# Patient Record
Sex: Male | Born: 1940 | Race: White | Hispanic: No | Marital: Married | Smoking: Never smoker
Health system: Southern US, Community
[De-identification: ages and names within clinical notes are randomized; demographics above are authoritative.]

## PROBLEM LIST (undated history)

## (undated) DIAGNOSIS — I10 Essential (primary) hypertension: Secondary | ICD-10-CM

## (undated) HISTORY — PX: CARDIAC CATHETERIZATION: SHX172

## (undated) HISTORY — PX: REPLACEMENT TOTAL KNEE: SUR1224

## (undated) HISTORY — PX: CORONARY ARTERY BYPASS GRAFT: SHX141

## (undated) HISTORY — DX: Essential (primary) hypertension: I10

---

## 2005-11-26 ENCOUNTER — Ambulatory Visit: Payer: Self-pay | Admitting: Cardiology

## 2009-02-09 ENCOUNTER — Inpatient Hospital Stay (HOSPITAL_COMMUNITY): Admission: AD | Admit: 2009-02-09 | Discharge: 2009-02-18 | Payer: Self-pay | Admitting: Cardiology

## 2009-02-10 ENCOUNTER — Encounter (INDEPENDENT_AMBULATORY_CARE_PROVIDER_SITE_OTHER): Payer: Self-pay | Admitting: Cardiology

## 2009-02-11 ENCOUNTER — Ambulatory Visit: Payer: Self-pay | Admitting: Surgery

## 2009-02-12 ENCOUNTER — Encounter: Payer: Self-pay | Admitting: Surgery

## 2009-02-21 DIAGNOSIS — I2510 Atherosclerotic heart disease of native coronary artery without angina pectoris: Secondary | ICD-10-CM | POA: Insufficient documentation

## 2009-02-21 HISTORY — DX: Atherosclerotic heart disease of native coronary artery without angina pectoris: I25.10

## 2009-03-08 ENCOUNTER — Ambulatory Visit: Payer: Self-pay | Admitting: Surgery

## 2009-03-08 ENCOUNTER — Encounter: Admission: RE | Admit: 2009-03-08 | Discharge: 2009-03-08 | Payer: Self-pay | Admitting: Surgery

## 2009-03-17 ENCOUNTER — Encounter (HOSPITAL_COMMUNITY): Admission: RE | Admit: 2009-03-17 | Discharge: 2009-04-07 | Payer: Self-pay | Admitting: Cardiology

## 2009-04-08 ENCOUNTER — Encounter (HOSPITAL_COMMUNITY): Admission: RE | Admit: 2009-04-08 | Discharge: 2009-07-07 | Payer: Self-pay | Admitting: Cardiology

## 2010-03-30 LAB — CBC
HCT: 33.3 % — ABNORMAL LOW (ref 39.0–52.0)
HCT: 35.1 % — ABNORMAL LOW (ref 39.0–52.0)
HCT: 35.4 % — ABNORMAL LOW (ref 39.0–52.0)
HCT: 35.5 % — ABNORMAL LOW (ref 39.0–52.0)
HCT: 35.8 % — ABNORMAL LOW (ref 39.0–52.0)
HCT: 37.1 % — ABNORMAL LOW (ref 39.0–52.0)
Hemoglobin: 11.5 g/dL — ABNORMAL LOW (ref 13.0–17.0)
Hemoglobin: 11.5 g/dL — ABNORMAL LOW (ref 13.0–17.0)
Hemoglobin: 11.8 g/dL — ABNORMAL LOW (ref 13.0–17.0)
Hemoglobin: 11.9 g/dL — ABNORMAL LOW (ref 13.0–17.0)
Hemoglobin: 12.1 g/dL — ABNORMAL LOW (ref 13.0–17.0)
Hemoglobin: 12.6 g/dL — ABNORMAL LOW (ref 13.0–17.0)
MCHC: 33.9 g/dL (ref 30.0–36.0)
MCHC: 34 g/dL (ref 30.0–36.0)
MCHC: 34.2 g/dL (ref 30.0–36.0)
MCHC: 34.3 g/dL (ref 30.0–36.0)
MCHC: 34.4 g/dL (ref 30.0–36.0)
MCHC: 34.5 g/dL (ref 30.0–36.0)
MCHC: 34.5 g/dL (ref 30.0–36.0)
MCHC: 34.5 g/dL (ref 30.0–36.0)
MCHC: 34.6 g/dL (ref 30.0–36.0)
MCV: 91.3 fL (ref 78.0–100.0)
MCV: 91.7 fL (ref 78.0–100.0)
MCV: 92.4 fL (ref 78.0–100.0)
Platelets: 152 10*3/uL (ref 150–400)
Platelets: 170 10*3/uL (ref 150–400)
Platelets: 195 10*3/uL (ref 150–400)
Platelets: 196 10*3/uL (ref 150–400)
RBC: 3.62 MIL/uL — ABNORMAL LOW (ref 4.22–5.81)
RBC: 3.63 MIL/uL — ABNORMAL LOW (ref 4.22–5.81)
RBC: 3.64 MIL/uL — ABNORMAL LOW (ref 4.22–5.81)
RBC: 3.71 MIL/uL — ABNORMAL LOW (ref 4.22–5.81)
RBC: 3.81 MIL/uL — ABNORMAL LOW (ref 4.22–5.81)
RBC: 3.93 MIL/uL — ABNORMAL LOW (ref 4.22–5.81)
RBC: 3.94 MIL/uL — ABNORMAL LOW (ref 4.22–5.81)
RBC: 4.01 MIL/uL — ABNORMAL LOW (ref 4.22–5.81)
RBC: 4.16 MIL/uL — ABNORMAL LOW (ref 4.22–5.81)
RDW: 14.2 % (ref 11.5–15.5)
RDW: 14.2 % (ref 11.5–15.5)
RDW: 14.3 % (ref 11.5–15.5)
RDW: 14.3 % (ref 11.5–15.5)
RDW: 14.3 % (ref 11.5–15.5)
RDW: 14.4 % (ref 11.5–15.5)
RDW: 14.9 % (ref 11.5–15.5)
WBC: 14.1 10*3/uL — ABNORMAL HIGH (ref 4.0–10.5)
WBC: 9.4 10*3/uL (ref 4.0–10.5)
WBC: 9.9 10*3/uL (ref 4.0–10.5)

## 2010-03-30 LAB — HEMOGLOBIN AND HEMATOCRIT, BLOOD
HCT: 22.3 % — ABNORMAL LOW (ref 39.0–52.0)
Hemoglobin: 11.2 g/dL — ABNORMAL LOW (ref 13.0–17.0)
Hemoglobin: 7.7 g/dL — ABNORMAL LOW (ref 13.0–17.0)

## 2010-03-30 LAB — POCT I-STAT, CHEM 8
BUN: 17 mg/dL (ref 6–23)
Calcium, Ion: 1.08 mmol/L — ABNORMAL LOW (ref 1.12–1.32)
Chloride: 104 mEq/L (ref 96–112)
Creatinine, Ser: 1.1 mg/dL (ref 0.4–1.5)
Creatinine, Ser: 1.8 mg/dL — ABNORMAL HIGH (ref 0.4–1.5)
Glucose, Bld: 184 mg/dL — ABNORMAL HIGH (ref 70–99)
Glucose, Bld: 256 mg/dL — ABNORMAL HIGH (ref 70–99)
HCT: 33 % — ABNORMAL LOW (ref 39.0–52.0)
Hemoglobin: 11.2 g/dL — ABNORMAL LOW (ref 13.0–17.0)
Hemoglobin: 11.6 g/dL — ABNORMAL LOW (ref 13.0–17.0)
Potassium: 4.6 mEq/L (ref 3.5–5.1)
Sodium: 136 mEq/L (ref 135–145)
TCO2: 26 mmol/L (ref 0–100)

## 2010-03-30 LAB — GLUCOSE, CAPILLARY
Glucose-Capillary: 115 mg/dL — ABNORMAL HIGH (ref 70–99)
Glucose-Capillary: 129 mg/dL — ABNORMAL HIGH (ref 70–99)
Glucose-Capillary: 182 mg/dL — ABNORMAL HIGH (ref 70–99)
Glucose-Capillary: 188 mg/dL — ABNORMAL HIGH (ref 70–99)
Glucose-Capillary: 200 mg/dL — ABNORMAL HIGH (ref 70–99)
Glucose-Capillary: 200 mg/dL — ABNORMAL HIGH (ref 70–99)
Glucose-Capillary: 203 mg/dL — ABNORMAL HIGH (ref 70–99)
Glucose-Capillary: 214 mg/dL — ABNORMAL HIGH (ref 70–99)
Glucose-Capillary: 220 mg/dL — ABNORMAL HIGH (ref 70–99)
Glucose-Capillary: 297 mg/dL — ABNORMAL HIGH (ref 70–99)

## 2010-03-30 LAB — BASIC METABOLIC PANEL
BUN: 19 mg/dL (ref 6–23)
BUN: 22 mg/dL (ref 6–23)
BUN: 9 mg/dL (ref 6–23)
CO2: 26 mEq/L (ref 19–32)
CO2: 26 mEq/L (ref 19–32)
CO2: 27 mEq/L (ref 19–32)
Calcium: 7.5 mg/dL — ABNORMAL LOW (ref 8.4–10.5)
Calcium: 7.9 mg/dL — ABNORMAL LOW (ref 8.4–10.5)
Calcium: 8 mg/dL — ABNORMAL LOW (ref 8.4–10.5)
Chloride: 100 mEq/L (ref 96–112)
Chloride: 100 mEq/L (ref 96–112)
Chloride: 101 mEq/L (ref 96–112)
Chloride: 102 mEq/L (ref 96–112)
GFR calc Af Amer: 53 mL/min — ABNORMAL LOW (ref >60)
GFR calc Af Amer: >60 mL/min (ref >60)
GFR calc Af Amer: >60 mL/min (ref >60)
GFR calc Af Amer: >60 mL/min (ref >60)
GFR calc non Af Amer: 44 mL/min — ABNORMAL LOW (ref >60)
GFR calc non Af Amer: 56 mL/min — ABNORMAL LOW (ref >60)
GFR calc non Af Amer: 57 mL/min — ABNORMAL LOW (ref >60)
Glucose, Bld: 124 mg/dL — ABNORMAL HIGH (ref 70–99)
Glucose, Bld: 146 mg/dL — ABNORMAL HIGH (ref 70–99)
Glucose, Bld: 209 mg/dL — ABNORMAL HIGH (ref 70–99)
Potassium: 3.1 mEq/L — ABNORMAL LOW (ref 3.5–5.1)
Potassium: 3.5 mEq/L (ref 3.5–5.1)
Potassium: 3.9 mEq/L (ref 3.5–5.1)
Potassium: 4.2 mEq/L (ref 3.5–5.1)
Sodium: 135 mEq/L (ref 135–145)
Sodium: 136 mEq/L (ref 135–145)
Sodium: 136 mEq/L (ref 135–145)
Sodium: 137 mEq/L (ref 135–145)

## 2010-03-30 LAB — CREATININE, SERUM
Creatinine, Ser: 1.05 mg/dL (ref 0.4–1.5)
GFR calc Af Amer: 46 mL/min — ABNORMAL LOW (ref >60)
GFR calc non Af Amer: >60 mL/min (ref >60)

## 2010-03-30 LAB — COMPREHENSIVE METABOLIC PANEL
Albumin: 3.6 g/dL (ref 3.5–5.2)
BUN: 11 mg/dL (ref 6–23)
BUN: 26 mg/dL — ABNORMAL HIGH (ref 6–23)
CO2: 26 mEq/L (ref 19–32)
Calcium: 8.6 mg/dL (ref 8.4–10.5)
Calcium: 8.8 mg/dL (ref 8.4–10.5)
Creatinine, Ser: 1.03 mg/dL (ref 0.4–1.5)
Glucose, Bld: 171 mg/dL — ABNORMAL HIGH (ref 70–99)
Sodium: 134 mEq/L — ABNORMAL LOW (ref 135–145)
Sodium: 139 mEq/L (ref 135–145)
Total Protein: 6.5 g/dL (ref 6.0–8.3)

## 2010-03-30 LAB — POCT I-STAT 3, ART BLOOD GAS (G3+)
Acid-base deficit: 2 mmol/L (ref 0.0–2.0)
Acid-base deficit: 4 mmol/L — ABNORMAL HIGH (ref 0.0–2.0)
Bicarbonate: 24 mEq/L (ref 20.0–24.0)
O2 Saturation: 95 %
O2 Saturation: 98 %
Patient temperature: 98
TCO2: 25 mmol/L (ref 0–100)
TCO2: 27 mmol/L (ref 0–100)
pCO2 arterial: 46.4 mmHg — ABNORMAL HIGH (ref 35.0–45.0)
pH, Arterial: 7.333 — ABNORMAL LOW (ref 7.350–7.450)
pH, Arterial: 7.345 — ABNORMAL LOW (ref 7.350–7.450)
pO2, Arterial: 113 mmHg — ABNORMAL HIGH (ref 80.0–100.0)
pO2, Arterial: 231 mmHg — ABNORMAL HIGH (ref 80.0–100.0)
pO2, Arterial: 86 mmHg (ref 80.0–100.0)

## 2010-03-30 LAB — POCT I-STAT 4, (NA,K, GLUC, HGB,HCT)
Glucose, Bld: 174 mg/dL — ABNORMAL HIGH (ref 70–99)
Glucose, Bld: 182 mg/dL — ABNORMAL HIGH (ref 70–99)
Glucose, Bld: 184 mg/dL — ABNORMAL HIGH (ref 70–99)
HCT: 24 % — ABNORMAL LOW (ref 39.0–52.0)
HCT: 34 % — ABNORMAL LOW (ref 39.0–52.0)
HCT: 36 % — ABNORMAL LOW (ref 39.0–52.0)
Hemoglobin: 12.2 g/dL — ABNORMAL LOW (ref 13.0–17.0)
Potassium: 3.6 mEq/L (ref 3.5–5.1)
Potassium: 4 mEq/L (ref 3.5–5.1)
Sodium: 133 mEq/L — ABNORMAL LOW (ref 135–145)
Sodium: 136 mEq/L (ref 135–145)

## 2010-03-30 LAB — DIFFERENTIAL
Eosinophils Relative: 1 % (ref 0–5)
Lymphocytes Relative: 33 % (ref 12–46)
Lymphs Abs: 3.1 10*3/uL (ref 0.7–4.0)
Monocytes Relative: 6 % (ref 3–12)
Neutrophils Relative %: 60 % (ref 43–77)

## 2010-03-30 LAB — PROTIME-INR
INR: 1.01 (ref 0.00–1.49)
INR: 1.02 (ref 0.00–1.49)
Prothrombin Time: 12.7 seconds (ref 11.6–15.2)
Prothrombin Time: 13.1 seconds (ref 11.6–15.2)
Prothrombin Time: 14.6 seconds (ref 11.6–15.2)

## 2010-03-30 LAB — APTT: aPTT: 70 seconds — ABNORMAL HIGH (ref 24–37)

## 2010-03-30 LAB — BLOOD GAS, ARTERIAL
FIO2: 0.21 %
Patient temperature: 98.6
TCO2: 24.1 mmol/L (ref 0–100)
pH, Arterial: 7.391 (ref 7.350–7.450)

## 2010-03-30 LAB — URINALYSIS, ROUTINE W REFLEX MICROSCOPIC
Nitrite: NEGATIVE
Specific Gravity, Urine: 1.017 (ref 1.005–1.030)
Urobilinogen, UA: 0.2 mg/dL (ref 0.0–1.0)

## 2010-03-30 LAB — LIPID PANEL
Cholesterol: 169 mg/dL (ref 0–200)
LDL Cholesterol: 72 mg/dL (ref 0–99)
Triglycerides: 290 mg/dL — ABNORMAL HIGH (ref <150)
VLDL: 70 mg/dL — ABNORMAL HIGH (ref 0–40)

## 2010-03-30 LAB — MAGNESIUM
Magnesium: 2.3 mg/dL (ref 1.5–2.5)
Magnesium: 2.4 mg/dL (ref 1.5–2.5)
Magnesium: 2.6 mg/dL — ABNORMAL HIGH (ref 1.5–2.5)

## 2010-03-30 LAB — CARDIAC PANEL(CRET KIN+CKTOT+MB+TROPI)
CK, MB: 0.9 ng/mL (ref 0.3–4.0)
Relative Index: INVALID (ref 0.0–2.5)
Total CK: 58 U/L (ref 7–232)
Troponin I: 0.05 ng/mL (ref 0.00–0.06)
Troponin I: 0.08 ng/mL — ABNORMAL HIGH (ref 0.00–0.06)

## 2010-03-30 LAB — HEPARIN LEVEL (UNFRACTIONATED)
Heparin Unfractionated: 0.29 IU/mL — ABNORMAL LOW (ref 0.30–0.70)
Heparin Unfractionated: 0.36 IU/mL (ref 0.30–0.70)
Heparin Unfractionated: 0.41 IU/mL (ref 0.30–0.70)

## 2010-03-30 LAB — PLATELET COUNT: Platelets: 128 10*3/uL — ABNORMAL LOW (ref 150–400)

## 2010-03-30 LAB — MRSA PCR SCREENING: MRSA by PCR: NEGATIVE

## 2010-03-30 LAB — HEMOGLOBIN A1C: Mean Plasma Glucose: 258 mg/dL

## 2010-03-30 LAB — TYPE AND SCREEN: Antibody Screen: NEGATIVE

## 2010-04-03 LAB — GLUCOSE, CAPILLARY
Glucose-Capillary: 102 mg/dL — ABNORMAL HIGH (ref 70–99)
Glucose-Capillary: 109 mg/dL — ABNORMAL HIGH (ref 70–99)
Glucose-Capillary: 80 mg/dL (ref 70–99)

## 2010-05-23 NOTE — Assessment & Plan Note (Signed)
OFFICE VISIT   Jackson [name], Jackson [name]  DOB:  June 13, 1940                                        March 08, 2009  CHART #:  [id number]   HISTORY:  The patient returned to my office today for followup status  post coronary artery bypass graft surgery x1 using a left internal  mammary graft to the LAD on February 13, 2009.  He has been feeling well  overall since discharge.  He is still sleeping in a reclining chair  because he does not feel comfortable lying flat.  He is walking short  distances outside using a walker without chest pain or shortness of  breath.  He and his wife have been watching their diet closely.   PHYSICAL EXAMINATION:  His blood pressure is 138/78, pulse 84 and  regular, respiratory rate is 18 unlabored.  Oxygen saturation on room  air is 96%.  He looks well.  Cardiac exam shows regular rate and rhythm  with a normal S1 and S2.  There is no murmur, rub, or gallop.  Lungs are  clear.  Chest incision is healing well and the sternum is stable.  There  is no peripheral edema.   Followup chest x-ray today shows minimal left pleural effusion blunting  the costophrenic angle.  Lungs otherwise clear.   MEDICATIONS:  1. NovoLog 6 units t.i.d.  2. Lantus 30 units b.i.d.  3. Lopressor 50 mg b.i.d.  4. Ultram p.r.n. for pain.  5. Aspirin 81 mg daily.  6. Sinemet b.i.d.   IMPRESSION:  Overall, the patient is recovering well following his  surgery.  I encouraged him to continue walking as much as possible.  He  is planning on participating in a cardiac rehab program.  I told him he  can return to driving a car, but should refrain from lifting anything  heavier than 10 pounds for a total of 3 months from date of surgery.  He  and his wife seem very motivated to modify his cardiac risk factors.  He  will continue to follow up with Dr. Lynnea Ferrier and will contact me if he  develops any problems with his incisions.   Evelene Croon, M.D.  Electronically  Signed   BB/MEDQ  D:  03/08/2009  T:  03/09/2009  Job:  [id number]   cc:   Ritta Slot, MD

## 2011-08-27 DIAGNOSIS — M109 Gout, unspecified: Secondary | ICD-10-CM | POA: Insufficient documentation

## 2011-11-20 IMAGING — CR DG CHEST 1V PORT
1 series · 1 of 1 positions shown · non-contrast
Comparison: 02/10/2009

CLINICAL DATA: Status post CABG

PORTABLE CHEST - 1 VIEW

[AP]
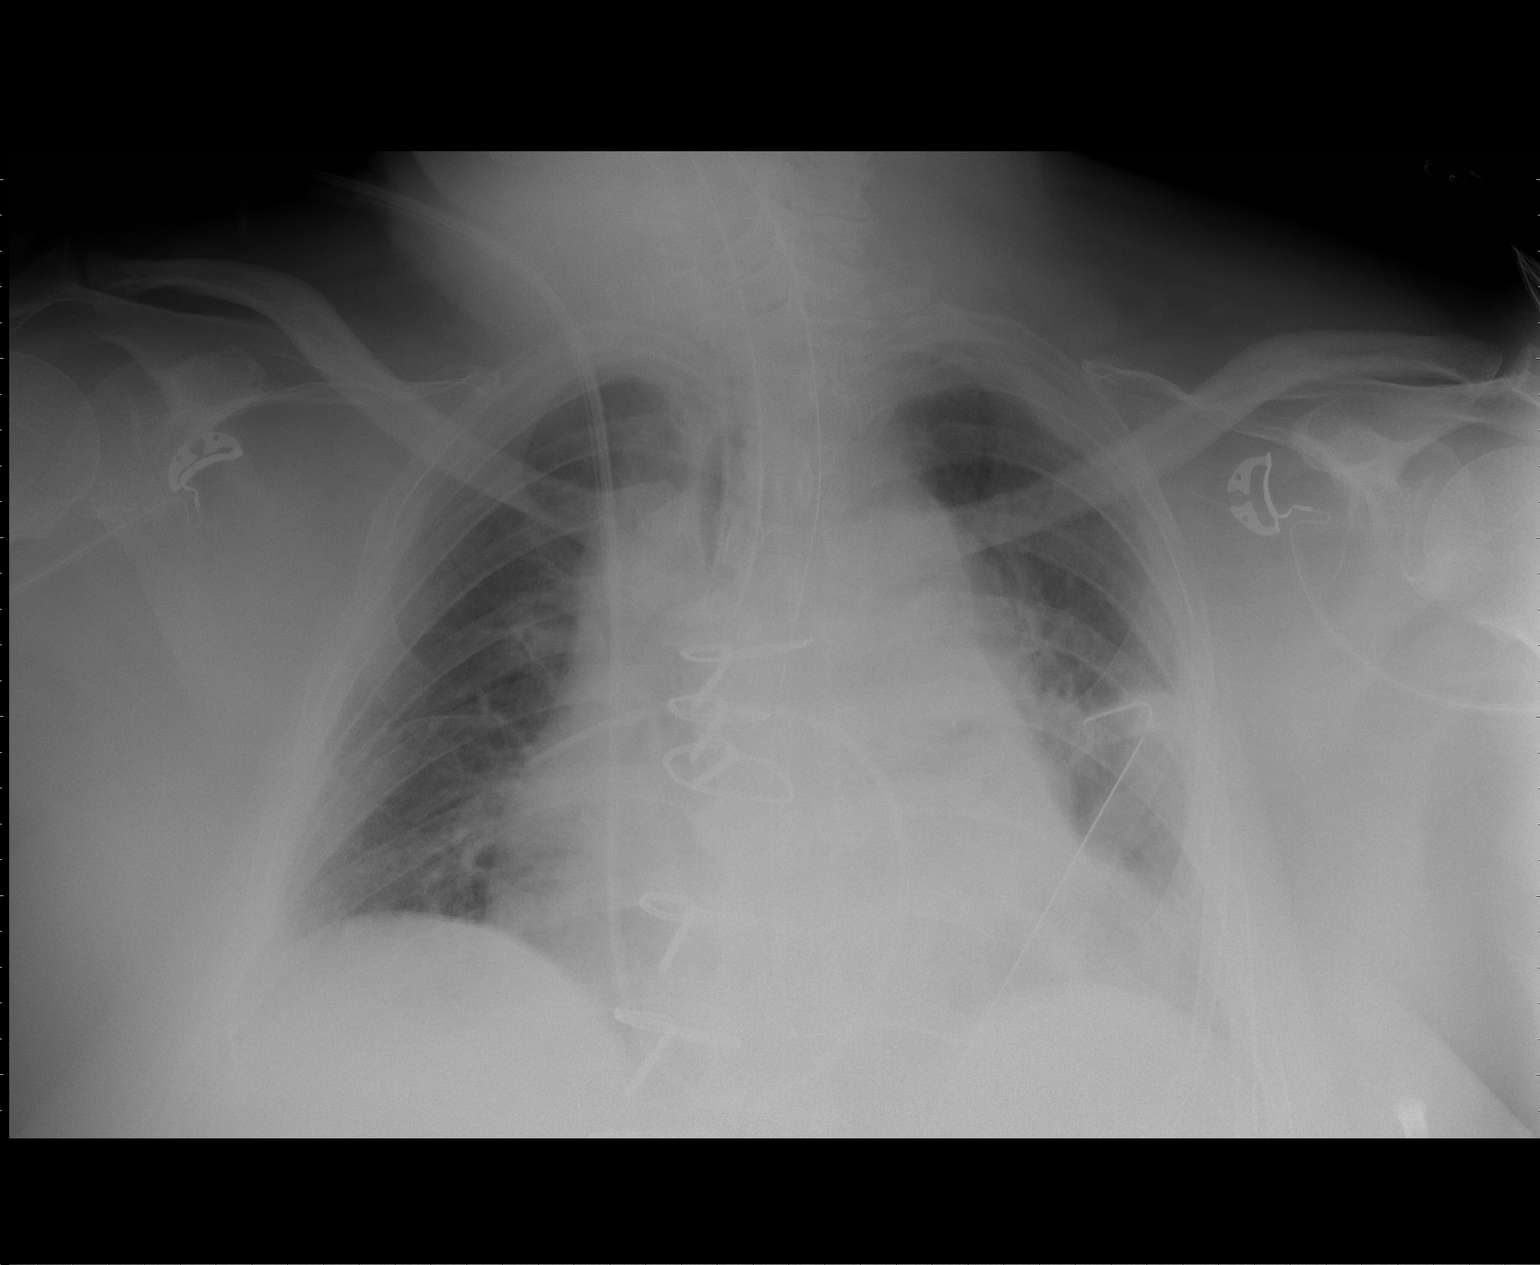

[1 of 1 positions shown; findings below may reference images not displayed]

FINDINGS: Endotracheal tube tip lies at the carina or at the origin
of the right main stem bronchus.  Recommend retraction.  Swan-Ganz
catheter tip lies in the distal right pulmonary artery.  Left chest
tube in place without pneumothorax.  Low lung volumes bilaterally
with bibasilar atelectasis present, left greater than right.  No
edema.
IMPRESSION: The endotracheal tube tip lies either at the carina or at the
origin of the right main stem bronchus.  Recommend appropriate
retraction.  Bibasilar atelectasis present without pneumothorax.

## 2012-12-11 DIAGNOSIS — N400 Enlarged prostate without lower urinary tract symptoms: Secondary | ICD-10-CM | POA: Insufficient documentation

## 2016-12-14 DIAGNOSIS — M12812 Other specific arthropathies, not elsewhere classified, left shoulder: Secondary | ICD-10-CM | POA: Insufficient documentation

## 2018-04-29 ENCOUNTER — Other Ambulatory Visit: Payer: Self-pay | Admitting: Cardiology

## 2018-05-12 DIAGNOSIS — N183 Chronic kidney disease, stage 3 unspecified: Secondary | ICD-10-CM | POA: Insufficient documentation

## 2018-05-21 NOTE — Progress Notes (Signed)
Primary Physician/Referring:  Lucila Maine  Patient ID: Jackson [name], male    DOB: 1941-01-03, 78 y.o.   MRN: [id number]  Chief Complaint  Patient presents with  . Hypertension  . Follow-up   This visit type was conducted due to national recommendations for restrictions regarding the COVID-19 Pandemic (e.g. social distancing).  This format is felt to be most appropriate for this patient at this time.  All issues noted in this document were discussed and addressed.  No physical exam was performed (except for noted visual exam findings with Telehealth visits).  The patient has consented to conduct a Telehealth visit and understands insurance will be billed.   I discussed the limitations of evaluation and management by telemedicine and the availability of in person appointments. The patient expressed understanding and agreed to proceed.  Virtual Visit via Video Note is as below  I connected with Mr. Shughart, on 05/22/18 at 0830 by telephone and verified that I am speaking with the correct person using two identifiers. Unable to perform video visit as patient did not have equipment.    I have discussed with the patient regarding the safety during COVID Pandemic and steps and precautions including social distancing with the patient.    HPI: KENDON SEDENO  is a 78 y.o. male  with history of known coronary artery disease s/p CABG 2011, hypertension, hyperlipidemia, obesity, and diabetes mellitus.   He is compliant with his medications and has no adverse effects. Had erectile dysfunction with Lipitor and is now on Crestor. Cholesterol is well controlled. Diabetes is also well controlled with last HgbA1c 5.8%.   He presents for annual f/u. No complaints today. Denies claudication, TIA or ulceration of the foot. Denies any chest pain or worsening shortness of breath. No PND or orthopnea.   Past Medical History:  Diagnosis Date  . CAD without angina: 02/11/09: 99% proximal , 70%  mid lesion in LAD.  CABG  02/14/09: LIMA to LAD 02/21/2009  . Controlled diabetes mellitus without complication, with long-term current use of insulin (HCC) 05/22/2018  . Hypertension     History reviewed. No pertinent surgical history.  Social History   Socioeconomic History  . Marital status: Married    Spouse name: Not on file  . Number of children: 2  . Years of education: Not on file  . Highest education level: Not on file  Occupational History  . Not on file  Social Needs  . Financial resource strain: Not on file  . Food insecurity:    Worry: Not on file    Inability: Not on file  . Transportation needs:    Medical: Not on file    Non-medical: Not on file  Tobacco Use  . Smoking status: Never Smoker  . Smokeless tobacco: Never Used  Substance and Sexual Activity  . Alcohol use: Not Currently  . Drug use: Not on file  . Sexual activity: Not on file  Lifestyle  . Physical activity:    Days per week: Not on file    Minutes per session: Not on file  . Stress: Not on file  Relationships  . Social connections:    Talks on phone: Not on file    Gets together: Not on file    Attends religious service: Not on file    Active member of club or organization: Not on file    Attends meetings of clubs or organizations: Not on file    Relationship status: Not on file  .  Intimate partner violence:    Fear of current or ex partner: Not on file    Emotionally abused: Not on file    Physically abused: Not on file    Forced sexual activity: Not on file  Other Topics Concern  . Not on file  Social History Narrative  . Not on file    Current Outpatient Medications on File Prior to Visit  Medication Sig Dispense Refill  . acetaminophen (TYLENOL) 325 MG tablet Take 650 mg by mouth every 6 (six) hours as needed.    Marland Kitchen. amLODipine (NORVASC) 5 MG tablet Take 5 mg by mouth daily.    Marland Kitchen. aspirin EC 81 MG tablet Take 81 mg by mouth daily.    . diclofenac (VOLTAREN) 0.1 % ophthalmic  solution as needed.    . fenofibrate 160 MG tablet Take 160 mg by mouth daily.    Marland Kitchen. olmesartan-hydrochlorothiazide (BENICAR HCT) 40-12.5 MG tablet Take 1 tablet by mouth daily.    Marland Kitchen. oxybutynin (DITROPAN) 5 MG tablet Take 5 mg by mouth 2 (two) times a day.    . propranolol ER (INDERAL LA) 120 MG 24 hr capsule Take 120 mg by mouth daily.    . rosuvastatin (CRESTOR) 20 MG tablet TAKE 1 TABLET BY MOUTH  DAILY 90 tablet 3  . tamsulosin (FLOMAX) 0.4 MG CAPS capsule Take 0.4 mg by mouth daily.     No current facility-administered medications on file prior to visit.     Review of Systems  Constitution: Negative for decreased appetite, malaise/fatigue, weight gain and weight loss.  Eyes: Negative for visual disturbance.  Cardiovascular: Positive for dyspnea on exertion. Negative for chest pain, claudication, leg swelling, orthopnea, palpitations and syncope.  Respiratory: Negative for hemoptysis and wheezing.   Endocrine: Negative for cold intolerance and heat intolerance.  Hematologic/Lymphatic: Does not bruise/bleed easily.  Skin: Negative for nail changes.  Musculoskeletal: Positive for joint pain (knee pain; walks with cane for support). Negative for muscle weakness and myalgias.  Gastrointestinal: Negative for abdominal pain, change in bowel habit, nausea and vomiting.  Neurological: Negative for difficulty with concentration, dizziness, focal weakness and headaches.  Psychiatric/Behavioral: Negative for altered mental status and suicidal ideas.  All other systems reviewed and are negative.     Objective  Blood pressure (!) 143/67, pulse 62, temperature (!) 96.1 F (35.6 C), height 5\' 5"  (1.651 m), weight 241 lb 9.6 oz (109.6 kg), SpO2 98 %. Body mass index is 40.2 kg/m.    Physical exam not performed or limited due to virtual visit.  Patient appeared to be in no distress, Neck was supple, respiration was not labored.  Please see exam details from prior visit is as below.   Physical  Exam  Constitutional: He is oriented to person, place, and time. Vital signs are normal. He appears well-developed and well-nourished.  Morbidly obese  HENT:  Head: Normocephalic and atraumatic.  Neck: Normal range of motion.  Cardiovascular: Normal rate, regular rhythm and intact distal pulses.  Murmur heard.  Early systolic murmur is present with a grade of 2/6 at the upper right sternal border. Pulses:      Carotid pulses are on the right side with bruit and on the left side with bruit.      Femoral pulses are 2+ on the right side and 2+ on the left side.      Popliteal pulses are 2+ on the right side and 2+ on the left side.       Dorsalis pedis pulses  are 2+ on the right side and 1+ on the left side.       Posterior tibial pulses are 1+ on the right side and 1+ on the left side.  Pulmonary/Chest: Effort normal and breath sounds normal. No accessory muscle usage. No respiratory distress.  Abdominal: Soft. Bowel sounds are normal.  Musculoskeletal: Normal range of motion.  Neurological: He is alert and oriented to person, place, and time.  Skin: Skin is warm and dry.  Vitals reviewed.  Radiology: No results found.  Laboratory examination:    CMP Latest Ref Rng & Units 02/17/2009 02/16/2009 02/15/2009  Glucose 70 - 99 mg/dL 161(W) 960(A) 540(J)  BUN 6 - 23 mg/dL 81(X) 22 18  Creatinine 0.4 - 1.5 mg/dL 9.14 7.82(N) 5.62(Z)  Sodium 135 - 145 mEq/L 135 136 137  Potassium 3.5 - 5.1 mEq/L 3.4(L) 3.5 3.9  Chloride 96 - 112 mEq/L 100 100 101  CO2 19 - 32 mEq/L Calcium 8.4 - 10.5 mg/dL 7.9(L) 7.8(L) 8.0(L)  Total Protein 6.0 - 8.3 g/dL - - -  Total Bilirubin 0.3 - 1.2 mg/dL - - -  Alkaline Phos 39 - 117 U/L - - -  AST 0 - 37 U/L - - -  ALT 0 - 53 U/L - - -   CBC Latest Ref Rng & Units 02/15/2009 02/14/2009 02/14/2009  WBC 4.0 - 10.5 K/uL 15.7(H) 14.5(H) -  Hemoglobin 13.0 - 17.0 g/dL 11.9(L) 11.5(L) 11.6(L)  Hematocrit 39.0 - 52.0 % 35.1(L) 33.5(L) 34.0(L)  Platelets 150 - 400  K/uL 191 152 -   Lipid Panel     Component Value Date/Time   CHOL  02/13/2009 0341    172        ATP III CLASSIFICATION:  <200     mg/dL   Desirable  308-657  mg/dL   Borderline High  >=846    mg/dL   High          TRIG 962 (H) 02/13/2009 0341   HDL 42 02/13/2009 0341   CHOLHDL 4.1 02/13/2009 0341   VLDL 58 (H) 02/13/2009 0341   LDLCALC  02/13/2009 0341    72        Total Cholesterol/HDL:CHD Risk Coronary Heart Disease Risk Table                     Men   Women  1/2 Average Risk   3.4   3.3  Average Risk       5.0   4.4  2 X Average Risk   9.6   7.1  3 X Average Risk  23.4   11.0        Use the calculated Patient Ratio above and the CHD Risk Table to determine the patient's CHD Risk.        ATP III CLASSIFICATION (LDL):  <100     mg/dL   Optimal  952-841  mg/dL   Near or Above                    Optimal  130-159  mg/dL   Borderline  324-401  mg/dL   High  >027     mg/dL   Very High   HEMOGLOBIN A1C Lab Results  Component Value Date   HGBA1C (H) 02/12/2009    10.6 (NOTE) The ADA recommends the following therapeutic goal for glycemic control related to Hgb A1c measurement: Goal of therapy: <6.5 Hgb A1c  Reference: American Diabetes Association:  Clinical Practice Recommendations 2010, Diabetes Care, 2010, 33: (Suppl  1).   MPG 258 02/12/2009   TSH No results for input(s): TSH in the last 8760 hours.  Cardiac Studies:   Carotid artery duplex 06-08-2016: Minimal stenosis in the right internal carotid artery (minimal). Antegrade right vertebral artery flow. Antegrade left vertebral artery flow. No significant change from 09/04/2010.  Coronary Angiography  02/11/09: 99% proximal , 70% mid lesion in LAD. EF 65%: CABG 02/14/09: LIMA to LAD, Dr. Laneta Simmers  Assessment   CAD without angina: 02/11/09: 99% proximal , 70% mid lesion in LAD.  CABG  02/14/09: LIMA to LAD  S/P CABG (coronary artery bypass graft) - CABG 02/14/09: LIMA to LAD, Dr. 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 Borderline criteria for LVH.  Recommendations:   This is a six-month virtual visit.  Patient is presently doing well without any significant complaints today.  Dyspnea on exertion has remained stable.  He has not had any chest pain.  He is on appropriate medical therapy.  Diabetes and hyperlipidemia that are being managed by PCP are well controlled, will continue the same.  Blood pressure slightly elevated today, but in previous office visits with PCP have been stable, will continue to monitor.  No changes were made to medications today.  Unfortunately he has not continued to lose weight since last seen by [place], but weight has remained stable.  I have encouraged him to continue with diet modifications to help with this.  He has history of trifascicular block on EKG.  As I am not seeing him in the office today, I recommended follow-up in 3 months in office so that he can have EKG performed for surveillance.  Toniann Fail, MSN, APRN, FNP-C [facility] Office: [phone] Fax: [phone]

## 2018-05-22 ENCOUNTER — Other Ambulatory Visit: Payer: Self-pay

## 2018-05-22 ENCOUNTER — Ambulatory Visit (INDEPENDENT_AMBULATORY_CARE_PROVIDER_SITE_OTHER): Payer: Medicare Other | Admitting: Cardiology

## 2018-05-22 ENCOUNTER — Encounter: Payer: Self-pay | Admitting: Cardiology

## 2018-05-22 VITALS — BP 143/67 | HR 62 | Temp 96.1°F | Ht 65.0 in | Wt 241.6 lb

## 2018-05-22 DIAGNOSIS — Z951 Presence of aortocoronary bypass graft: Secondary | ICD-10-CM

## 2018-05-22 DIAGNOSIS — E782 Mixed hyperlipidemia: Secondary | ICD-10-CM

## 2018-05-22 DIAGNOSIS — Z794 Long term (current) use of insulin: Secondary | ICD-10-CM

## 2018-05-22 DIAGNOSIS — I2510 Atherosclerotic heart disease of native coronary artery without angina pectoris: Secondary | ICD-10-CM

## 2018-05-22 DIAGNOSIS — I10 Essential (primary) hypertension: Secondary | ICD-10-CM

## 2018-05-22 DIAGNOSIS — E119 Type 2 diabetes mellitus without complications: Secondary | ICD-10-CM

## 2018-05-22 DIAGNOSIS — I453 Trifascicular block: Secondary | ICD-10-CM | POA: Insufficient documentation

## 2018-05-22 HISTORY — DX: Type 2 diabetes mellitus without complications: E11.9

## 2018-05-22 HISTORY — DX: Long term (current) use of insulin: Z79.4

## 2018-08-02 ENCOUNTER — Other Ambulatory Visit: Payer: Self-pay | Admitting: Cardiology

## 2018-09-01 ENCOUNTER — Encounter: Payer: Self-pay | Admitting: Cardiology

## 2018-09-01 ENCOUNTER — Encounter (INDEPENDENT_AMBULATORY_CARE_PROVIDER_SITE_OTHER): Payer: Self-pay

## 2018-09-01 ENCOUNTER — Ambulatory Visit (INDEPENDENT_AMBULATORY_CARE_PROVIDER_SITE_OTHER): Payer: Medicare Other | Admitting: Cardiology

## 2018-09-01 ENCOUNTER — Other Ambulatory Visit: Payer: Self-pay

## 2018-09-01 VITALS — BP 138/63 | HR 70 | Temp 97.8°F | Ht 65.0 in | Wt 236.0 lb

## 2018-09-01 DIAGNOSIS — E782 Mixed hyperlipidemia: Secondary | ICD-10-CM

## 2018-09-01 DIAGNOSIS — I453 Trifascicular block: Secondary | ICD-10-CM

## 2018-09-01 DIAGNOSIS — I10 Essential (primary) hypertension: Secondary | ICD-10-CM | POA: Diagnosis not present

## 2018-09-01 DIAGNOSIS — I2510 Atherosclerotic heart disease of native coronary artery without angina pectoris: Secondary | ICD-10-CM | POA: Diagnosis not present

## 2018-09-01 NOTE — Progress Notes (Signed)
Primary Physician/Referring:  [name]  Patient ID: Jackson [name], male    DOB: [birth year], 78 y.o.   MRN: [id number]  Chief Complaint  Patient presents with  . Coronary Artery Disease  . trifasicular block  . Follow-up    3 mth   HPI: Jackson [name]  is a 78 y.o. male  with history of known coronary artery disease s/p CABG 2011, hypertension, hyperlipidemia, obesity, and diabetes mellitus.   He is compliant with his medications and has no adverse effects. Had erectile dysfunction with Lipitor and is now on Crestor with resolution of symptoms.  He presents for 3 month f/u. No complaints today. Denies claudication, TIA or ulceration of the foot. Denies any chest pain or worsening shortness of breath. No PND or orthopnea. He denies dizziness, fatigue, palpitations or syncope.  Past Medical History:  Diagnosis Date  . CAD without angina: 02/11/09: 99% proximal , 70% mid lesion in LAD.  CABG  02/14/09: LIMA to LAD 02/21/2009  . Controlled diabetes mellitus without complication, with long-term current use of insulin ([place]) 05/22/2018  . Hypertension    History reviewed. No pertinent surgical history.  Social History   Socioeconomic History  . Marital status: Married    Spouse name: Not on file  . Number of children: 2  . Years of education: Not on file  . Highest education level: Not on file  Occupational History  . Not on file  Social Needs  . Financial resource strain: Not on file  . Food insecurity    Worry: Not on file    Inability: Not on file  . Transportation needs    Medical: Not on file    Non-medical: Not on file  Tobacco Use  . Smoking status: Never Smoker  . Smokeless tobacco: Never Used  Substance and Sexual Activity  . Alcohol use: Not Currently  . Drug use: Not on file  . Sexual activity: Not on file  Lifestyle  . Physical activity    Days per week: Not on file    Minutes per session: Not on file  . Stress: Not on file  Relationships  .  Social Herbalist on phone: Not on file    Gets together: Not on file    Attends religious service: Not on file    Active member of club or organization: Not on file    Attends meetings of clubs or organizations: Not on file    Relationship status: Not on file  . Intimate partner violence    Fear of current or ex partner: Not on file    Emotionally abused: Not on file    Physically abused: Not on file    Forced sexual activity: Not on file  Other Topics Concern  . Not on file  Social History Narrative  . Not on file    Current Outpatient Medications on File Prior to Visit  Medication Sig Dispense Refill  . acetaminophen (TYLENOL) 325 MG tablet Take 650 mg by mouth every 6 (six) hours as needed.    Marland Kitchen amLODipine (NORVASC) 5 MG tablet TAKE 1 TABLET BY MOUTH  DAILY 90 tablet 3  . aspirin EC 81 MG tablet Take 81 mg by mouth daily.    . diclofenac (VOLTAREN) 0.1 % ophthalmic solution as needed.    . fenofibrate 160 MG tablet Take 160 mg by mouth daily.    Marland Kitchen olmesartan-hydrochlorothiazide (BENICAR HCT) 40-12.5 MG tablet Take 1 tablet by mouth daily.    Marland Kitchen  oxybutynin (DITROPAN) 5 MG tablet Take 5 mg by mouth 2 (two) times a day.    . propranolol ER (INDERAL LA) 120 MG 24 hr capsule Take 120 mg by mouth daily.    . rosuvastatin (CRESTOR) 20 MG tablet TAKE 1 TABLET BY MOUTH  DAILY 90 tablet 3  . tamsulosin (FLOMAX) 0.4 MG CAPS capsule Take 0.4 mg by mouth daily.     No current facility-administered medications on file prior to visit.    Review of Systems  Constitution: Negative for decreased appetite, malaise/fatigue, weight gain and weight loss.  Eyes: Negative for visual disturbance.  Cardiovascular: Positive for dyspnea on exertion (stable). Negative for chest pain, claudication, leg swelling, orthopnea, palpitations and syncope.  Respiratory: Negative for hemoptysis and wheezing.   Endocrine: Negative for cold intolerance and heat intolerance.  Hematologic/Lymphatic: Does  not bruise/bleed easily.  Skin: Negative for nail changes.  Musculoskeletal: Positive for joint pain (knee pain; walks with cane for support). Negative for muscle weakness and myalgias.  Gastrointestinal: Negative for abdominal pain, change in bowel habit, nausea and vomiting.  Neurological: Negative for difficulty with concentration, dizziness, focal weakness and headaches.  Psychiatric/Behavioral: Negative for altered mental status and suicidal ideas.  All other systems reviewed and are negative.  Objective  Blood pressure 138/63, pulse 70, temperature 97.8 F (36.6 C), height _0  (1.651 m), weight 236 lb (107 kg), SpO2 98 %. Body mass index is 39.27 kg/m.  Physical Exam  Constitutional: He is oriented to person, place, and time. Vital signs are normal. He appears well-developed and well-nourished.  Morbidly obese  HENT:  Head: Normocephalic and atraumatic.  Neck: Normal range of motion.  Cardiovascular: Normal rate, regular rhythm and intact distal pulses.  Murmur heard.  Early systolic murmur is present with a grade of 2/6 at the upper right sternal border. Pulses:      Carotid pulses are on the right side with bruit and on the left side with bruit.      Femoral pulses are 2+ on the right side and 2+ on the left side.      Popliteal pulses are 2+ on the right side and 2+ on the left side.       Dorsalis pedis pulses are 2+ on the right side and 1+ on the left side.       Posterior tibial pulses are 1+ on the right side and 1+ on the left side.  2 plus ankle edema  Pulmonary/Chest: Effort normal and breath sounds normal. No accessory muscle usage. No respiratory distress.  Abdominal: Soft. Bowel sounds are normal.  Musculoskeletal: Normal range of motion.  Neurological: He is alert and oriented to person, place, and time.  Skin: Skin is warm and dry.  Vitals reviewed.  Radiology: No results found.  Laboratory examination:   LP+Non-HDL CholesterolResulted: 06/10/2018 11:35  AM [facility] Component Name Value Ref Range Cholesterol, Total 127 100 - 199 mg/dL Triglycerides 114 0 - 149 mg/dL HDL 45 >39 mg/dL VLDL Cholesterol Cal 23 5 - 40 mg/dL LDL 59 0 - 99 mg/dL  Comprehensive Metabolic PanelResulted: 01/14/3844 11:35 AM [facility] Component Name Value Ref Range Glucose 73 65 - 99 mg/dL BUN 24 8 - 27 mg/dL Creatinine, Serum 1.26 0.76 - 1.27 mg/dL eGFR If NonAfrican American 54 (L) >59 mL/min/1.73 eGFR If African American 62 >59 mL/min/1.73 BUN/Creatinine Ratio 19 10 - 24  Sodium 141 134 - 144 mmol/L Potassium 4.1 3.5 - 5.2 mmol/L Chloride 103 96 - 106 mmol/L CO2  22 20 - 29 mmol/L CALCIUM 9.4 8.6 - 10.2 mg/dL Total Protein 6.7 6 - 8.5 g/dL Albumin, Serum 4.4 3.7 - 4.7 g/dL Globulin, Total 2.3 1.5 - 4.5 g/dL Albumin/Globulin Ratio 1.9 1.2 - 2.2  Total Bilirubin 0.3 0 - 1.2 mg/dL Alkaline Phosphatase 35 (L) 39 - 117 IU/L AST 17 0 - 40 IU/L ALT (SGPT) 10 0 - 44 IU/L  A1C 5.8  Cardiac Studies:   Carotid artery duplex 05/28/2016: Minimal stenosis in the right internal carotid artery (minimal). Antegrade right vertebral artery flow. Antegrade left vertebral artery flow. No significant change from 09/04/2010.  Coronary Angiography  02/11/09: 99% proximal , 70% mid lesion in LAD. EF 65%: CABG 02/14/09: LIMA to LAD, Dr. Cyndia Bent  Assessment   CAD without angina: 02/11/09: 99% proximal , 70% mid lesion in LAD.  CABG  02/14/09: LIMA to LAD  Mixed hyperlipidemia  Essential hypertension  Trifascicular block - Plan: EKG 12-Lead  EKG 09/01/2018: Sinus rhythm with first-degree AV block at the rate of 60 bpm, left axis deviation, left anterior fascicular block.  Right bundle branch block.  Trifascicular block. No significant change from  EKG 04/14/2017   Recommendations:   Patient was seen by me on visit 2 months ago.  In view of underlying significant cardiac risk factors and bypass surgery in the past and also presence of trifascicular block on the EKG, I  wanted to see him back in the office.  He remains asymptomatic except for mild chronic dyspnea and degenerative joint disease.  Also reviewed his recently performed labs,  Cholesterol is well controlled. Diabetes is also well controlled with last HgbA1c 5.8%.  EKG reveals no significant change from prior EKG.  I will continue to monitor this.  He is also on fairly high dose of nonselective beta-blocker, propranolol will continue the same for now.  I would like to see him back in a year or sooner if problems.  He does state that his erectile dysfunction has essentially resolved after changing from atorvastatin to Crestor.  I will see him back in a year.  He will contact me if he were to notice any dizziness, worsening fatigue, near syncope.

## 2019-08-28 ENCOUNTER — Other Ambulatory Visit: Payer: Self-pay

## 2019-08-28 ENCOUNTER — Encounter: Payer: Self-pay | Admitting: Cardiology

## 2019-08-28 ENCOUNTER — Ambulatory Visit: Payer: Medicare Other | Admitting: Cardiology

## 2019-08-28 VITALS — BP 117/57 | HR 74 | Resp 17 | Ht 65.0 in | Wt 225.0 lb

## 2019-08-28 DIAGNOSIS — Z794 Long term (current) use of insulin: Secondary | ICD-10-CM

## 2019-08-28 DIAGNOSIS — N1831 Chronic kidney disease, stage 3a: Secondary | ICD-10-CM

## 2019-08-28 DIAGNOSIS — E119 Type 2 diabetes mellitus without complications: Secondary | ICD-10-CM

## 2019-08-28 DIAGNOSIS — I2510 Atherosclerotic heart disease of native coronary artery without angina pectoris: Secondary | ICD-10-CM

## 2019-08-28 DIAGNOSIS — E782 Mixed hyperlipidemia: Secondary | ICD-10-CM

## 2019-08-28 DIAGNOSIS — E118 Type 2 diabetes mellitus with unspecified complications: Secondary | ICD-10-CM

## 2019-08-28 DIAGNOSIS — I10 Essential (primary) hypertension: Secondary | ICD-10-CM

## 2019-08-28 NOTE — Progress Notes (Signed)
Primary Physician/Referring:  [name]  Patient ID: Jackson [name], male    DOB: 1941-01-01, 79 y.o.   MRN: [id number]  Chief Complaint  Patient presents with  . Coronary Artery Disease  . Hypertension  . Follow-up    1 year   HPI: Jackson [name]  is a 79 y.o. male  with history of known coronary artery disease s/p CABG 2011, hypertension, hyperlipidemia, obesity, and diabetes mellitus.   Patient is compliant with guideline directed medical therapy. He is tolerating medications well without adverse effects. Denies chest pain, worsening shortness of breath, PND, orthopnea, claudication, palpitations, syncope. Patient reports knee pain, worse in his right knee, which limits his physical activity. Does some work around the house, but no formal exercise or walking. Patient does report some stress at home secondary to caring for his son who has special needs.   Past Medical History:  Diagnosis Date  . CAD without angina: 02/11/09: 99% proximal , 70% mid lesion in LAD.  CABG  02/14/09: LIMA to LAD 02/21/2009  . Controlled diabetes mellitus without complication, with long-term current use of insulin ([facility]) 05/22/2018  . Hypertension    Past Surgical History:  Procedure Laterality Date  . CARDIAC CATHETERIZATION     02/11/09: 99% proximal , 70% mid lesion in LAD. EF 65%:  Marland Kitchen CORONARY ARTERY BYPASS GRAFT     CABG 02/14/09: LIMA to LAD, Dr. Cyndia Bent    Social History   Tobacco Use  . Smoking status: Never Smoker  . Smokeless tobacco: Never Used  Substance Use Topics  . Alcohol use: Not Currently   Marital Status: Married   Current Outpatient Medications on File Prior to Visit  Medication Sig Dispense Refill  . acetaminophen (TYLENOL) 325 MG tablet Take 650 mg by mouth every 6 (six) hours as needed.    Marland Kitchen amLODipine (NORVASC) 5 MG tablet TAKE 1 TABLET BY MOUTH  DAILY 90 tablet 3  . aspirin EC 81 MG tablet Take 81 mg by mouth daily.    . diclofenac (VOLTAREN) 0.1 % ophthalmic  solution as needed.    . fenofibrate 160 MG tablet Take 160 mg by mouth daily.    Marland Kitchen olmesartan-hydrochlorothiazide (BENICAR HCT) 40-12.5 MG tablet Take 1 tablet by mouth daily.    Marland Kitchen oxybutynin (DITROPAN) 5 MG tablet Take 5 mg by mouth 2 (two) times a day.    . propranolol ER (INDERAL LA) 120 MG 24 hr capsule Take 120 mg by mouth daily.    . rosuvastatin (CRESTOR) 20 MG tablet TAKE 1 TABLET BY MOUTH  DAILY 90 tablet 3  . tamsulosin (FLOMAX) 0.4 MG CAPS capsule Take 0.4 mg by mouth daily.     No current facility-administered medications on file prior to visit.   Review of Systems  Constitutional: Negative for decreased appetite, malaise/fatigue, weight gain and weight loss.  Eyes: Negative for visual disturbance.  Cardiovascular: Negative for chest pain, claudication, dyspnea on exertion, leg swelling, orthopnea, palpitations and syncope.  Respiratory: Negative for hemoptysis and wheezing.   Endocrine: Negative for cold intolerance and heat intolerance.  Hematologic/Lymphatic: Does not bruise/bleed easily.  Skin: Negative for nail changes.  Musculoskeletal: Positive for joint pain (knee pain; walks with cane for support). Negative for muscle weakness and myalgias.  Gastrointestinal: Negative for abdominal pain, change in bowel habit, melena, nausea and vomiting.  Neurological: Negative for difficulty with concentration, dizziness, focal weakness and headaches.  Psychiatric/Behavioral: Negative for altered mental status and suicidal ideas.  All other systems  reviewed and are negative.  Objective  Blood pressure (!) 117/57, pulse 74, resp. rate 17, height '5\' 5"'  (1.651 m), weight 225 lb (102.1 kg), SpO2 98 %. Body mass index is 37.44 kg/m.   Physical Exam Vitals reviewed.  Constitutional:      Appearance: He is well-developed.     Comments: Morbidly obese  HENT:     Head: Normocephalic and atraumatic.  Cardiovascular:     Rate and Rhythm: Normal rate and regular rhythm.     Pulses:  Intact distal pulses.          Carotid pulses are on the right side with bruit and on the left side with bruit.      Femoral pulses are 2+ on the right side and 2+ on the left side.      Popliteal pulses are 1+ on the right side and 1+ on the left side.       Dorsalis pedis pulses are 1+ on the right side and 1+ on the left side.       Posterior tibial pulses are 1+ on the right side and 1+ on the left side.     Heart sounds: Heart sounds are distant. No murmur heard.      Comments: No leg edema.  Pulmonary:     Effort: Pulmonary effort is normal. No accessory muscle usage or respiratory distress.     Breath sounds: Normal breath sounds.  Abdominal:     General: Bowel sounds are normal.     Palpations: Abdomen is soft.  Musculoskeletal:        General: Normal range of motion.     Cervical back: Normal range of motion.  Skin:    General: Skin is warm and dry.  Neurological:     Mental Status: He is alert and oriented to person, place, and time.    Radiology: No results found.  Laboratory examination:   LP+Non-HDL Cholesterol 07/30/2019: Cholesterol, Total 100 - 199 mg/dL 136        Triglycerides 0 - 149 mg/dL 71        HDL >39 mg/dL 54        VLDL Cholesterol Cal 5 - 40 mg/dL 14        LDL 0 - 99 mg/dL 68     NON-HDL CHOLESTEROL 0 - 129 mg/dL 82    CMP 07/30/2019  Glucose 65 - 99 mg/dL 86        BUN 8 - 27 mg/dL 31High        Creatinine 0.76 - 1.27 mg/dL 1.44High        eGFR If NonAfrican American >59 mL/min/1.73 45Low        eGFR If African American >59 mL/min/1.73 52Low    BUN/Creatinine Ratio 10 - 24  22        Sodium 134 - 144 mmol/L 141        Potassium 3.5 - 5.2 mmol/L 3.8        Chloride 96 - 106 mmol/L 104        CO2 20 - 29 mmol/L 23        CALCIUM 8.6 - 10.2 mg/dL 9.2        Total Protein 6.0 - 8.5 g/dL 6.9        Albumin, Serum 3.6 - 4.6 g/dL 4.6        Globulin, Total 1.5 - 4.5 g/dL 2.3        Albumin/Globulin Ratio 1.2 - 2.2  2.0        Total Bilirubin 0.0 - 1.2 mg/dL 0.5        Alkaline Phosphatase 48 - 121 IU/L 41Low     AST 0 - 40 IU/L 16        ALT (SGPT) 0 - 44 IU/L 9     Albumin/Cr Ratio 07/30/2019:  Creatinine, Ur Not Estab. mg/dL 81.7        Albumin, Urine Not Estab. ug/mL 5.1        Alb/Creat Ratio 0 - 29 mg/g creat 6     A1C 07/30/2019: 5.6   Cardiac Studies:   Carotid artery duplex 06/07/2016: Minimal stenosis in the right internal carotid artery (minimal). Antegrade right vertebral artery flow. Antegrade left vertebral artery flow. No significant change from 09/04/2010.  Coronary Angiography  02/11/09: 99% proximal , 70% mid lesion in LAD. EF 65%: CABG 02/14/09: LIMA to LAD, Dr. Cyndia Bent.  EKG:  EKG 08/28/2019: Sinus rhythm at 71 bpm with first degree AV block. Left axis deviation. 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 No significant change from EKG 09/01/2018.   Assessment   Coronary artery disease involving native coronary artery of native heart without angina pectoris - Plan: EKG 12-Lead  Mixed hyperlipidemia  Essential hypertension  Controlled type 2 diabetes mellitus without complication, with long-term current use of insulin ([facility])   Recommendations:   RAYGEN DAHM  is a 79 y.o. male  with history of known coronary artery disease s/p CABG 2011, hypertension, hyperlipidemia, obesity, and diabetes mellitus.  Patient presents today for 1 year follow up. He reports chronic mild dyspnea which is stable and right knee pain. He is otherwise asymptomatic.   Reviewed recent labs. Diabetes is well controlled with recent A1C 5.6. Patient does use insulin. His diabetes is managed by PCP. Cholesterol is also well controlled on Crestor and Fenofibrate. Blood pressure remains controlled as well with current medical therapy. There is no significant change in EKG compared to 09/01/2018. Will continue guideline directed medical therapy for coronary artery disease.    Renal function has slightly worsened compared to last year, but overall stable stage 3 CKD with controlled DM.    Adrian Prows, MD, [facility] 08/30/2019, 10:15 AM Office: [phone]

## 2019-08-30 ENCOUNTER — Encounter: Payer: Self-pay | Admitting: Cardiology

## 2019-08-31 ENCOUNTER — Ambulatory Visit: Payer: Medicare Other | Admitting: Cardiology

## 2019-09-07 ENCOUNTER — Other Ambulatory Visit: Payer: Self-pay | Admitting: Cardiology

## 2020-08-29 ENCOUNTER — Encounter: Payer: Self-pay | Admitting: Cardiology

## 2020-08-29 ENCOUNTER — Other Ambulatory Visit: Payer: Self-pay

## 2020-08-29 ENCOUNTER — Ambulatory Visit: Payer: Medicare Other | Admitting: Cardiology

## 2020-08-29 VITALS — BP 124/67 | HR 71 | Temp 98.4°F | Resp 16 | Ht 65.0 in | Wt 231.0 lb

## 2020-08-29 DIAGNOSIS — I10 Essential (primary) hypertension: Secondary | ICD-10-CM

## 2020-08-29 DIAGNOSIS — I453 Trifascicular block: Secondary | ICD-10-CM

## 2020-08-29 DIAGNOSIS — I2510 Atherosclerotic heart disease of native coronary artery without angina pectoris: Secondary | ICD-10-CM

## 2020-08-29 DIAGNOSIS — E782 Mixed hyperlipidemia: Secondary | ICD-10-CM

## 2020-08-29 NOTE — Progress Notes (Signed)
Primary Physician/Referring:  Jackson Jackson [name]  Patient ID: Jackson Jackson [name], Jackson [name]    DOB: September 01, 1940, 80 y.o.   MRN: [id number]  Chief Complaint  Patient presents with   Coronary Artery Disease   Follow-up    1 year    HPI: Jackson Jackson [name]  is a 80 y.o. Jackson [name]  Jackson [name]  with history of known coronary artery disease s/p CABG 2011, hypertension, hyperlipidemia, obesity, and diabetes mellitus.  Patient presents today for 1 year follow up. He reports chronic mild dyspnea which is stable and right knee pain.  He is now contemplating knee replacement, he is otherwise asymptomatic.   Patient is compliant with guideline directed medical therapy. He is tolerating medications well without adverse effects. Denies chest pain, worsening shortness of breath, PND, orthopnea, claudication, palpitations, syncope. Patient reports knee pain, worse in his right knee, which limits his physical activity.   Past Medical History:  Diagnosis Date   CAD without angina: 02/11/09: 99% proximal , 70% mid lesion in LAD.  CABG  02/14/09: LIMA to LAD 02/21/2009   Controlled diabetes mellitus without complication, with long-term current use of insulin (Jackson [name]) 05/22/2018   Hypertension    Past Surgical History:  Procedure Laterality Date   CARDIAC CATHETERIZATION     02/11/09: 99% proximal , 70% mid lesion in LAD. EF 65%:   CORONARY ARTERY BYPASS GRAFT     CABG 02/14/09: LIMA to LAD, Dr. Cyndia [name]    Social History   Tobacco Use   Smoking status: Never   Smokeless tobacco: Never  Substance Use Topics   Alcohol use: Not Currently   Marital Status: Married   Current Outpatient Medications on File Prior to Visit  Medication Sig Dispense Refill   acetaminophen (TYLENOL) 325 MG tablet Take 650 mg by mouth every 6 (six) hours as needed.     amLODipine (NORVASC) 5 MG tablet TAKE 1 TABLET BY MOUTH  DAILY 90 tablet 3   aspirin EC 81 MG tablet Take 81 mg by mouth daily.     fenofibrate 160 MG tablet Take 160 mg by mouth daily.      olmesartan-hydrochlorothiazide (BENICAR HCT) 40-12.5 MG tablet Take 1 tablet by mouth daily.     oxybutynin (DITROPAN) 5 MG tablet Take 5 mg by mouth 2 (two) times a day.     propranolol ER (INDERAL LA) 120 MG 24 hr capsule Take 120 mg by mouth daily.     rosuvastatin (CRESTOR) 20 MG tablet TAKE 1 TABLET BY MOUTH  DAILY 90 tablet 3   tamsulosin (FLOMAX) 0.4 MG CAPS capsule Take 0.4 mg by mouth daily.     No current facility-administered medications on file prior to visit.   Review of Systems  Cardiovascular:  Negative for chest pain, dyspnea on exertion (chronic and stable) and leg swelling.  Musculoskeletal:  Positive for joint pain.  Gastrointestinal:  Negative for melena.  Objective  Blood pressure 124/67, pulse 71, temperature 98.4 F (36.9 C), temperature source Temporal, resp. rate 16, height _0  (1.651 m), weight 231 lb (104.8 kg), SpO2 96 %. Body mass index is 38.44 kg/m.   Physical Exam Vitals reviewed.  Constitutional:      Appearance: He is well-developed.     Comments: Morbidly obese  Neck:     Vascular: No carotid bruit or JVD.  Cardiovascular:     Rate and Rhythm: Normal rate and regular rhythm.     Pulses: Normal pulses and intact distal pulses.     Heart  sounds: Heart sounds are distant. No murmur heard. Pulmonary:     Effort: Pulmonary effort is normal. No accessory muscle usage or respiratory distress.     Breath sounds: Normal breath sounds.  Abdominal:     General: Bowel sounds are normal.     Palpations: Abdomen is soft.  Musculoskeletal:        General: No swelling. Normal range of motion.     Cervical back: Normal range of motion.  Skin:    General: Skin is warm and dry.  Neurological:     Mental Status: He is oriented to person, place, and time.   Radiology: No results found.  Laboratory examination:   Labs 08/04/2020:  A1c 6.0%.  Serum glucose 78 mg, BUN 23, creatinine 1.38, EGFR 47 mL, potassium 4.0, CMP otherwise normal.  Total  cholesterol 127, triglycerides 69, HDL 53, LDL 60.  Cardiac Studies:   Carotid artery duplex 2016/06/21: Minimal stenosis in the right internal carotid artery (minimal). Antegrade right vertebral artery flow. Antegrade left vertebral artery flow. No significant change from 09/04/2010.  Coronary Angiography  02/11/09: 99% proximal , 70% mid lesion in LAD. EF 65%: CABG 02/14/09: LIMA to LAD, Dr. Cyndia [name].  EKG:  EKG 08/29/2020: Sinus rhythm with first-degree AV block at rate of 69 bpm, left atrial enlargement, left axis deviation, left anterior fascicular block.  Right bundle branch block.  Trifascicular block.  No change from 08/28/2019.  Assessment   Coronary artery disease involving native coronary artery of native heart without angina pectoris - Plan: EKG 12-Lead  Essential hypertension  Trifascicular block  Mixed hyperlipidemia   Recommendations:   Jackson Jackson [name]  is a 80 y.o. Jackson [name]  with history of known coronary artery disease s/p CABG 2011, hypertension, hyperlipidemia, obesity, and diabetes mellitus.  Patient presents today for 1 year follow up. He reports chronic mild dyspnea which is stable and right knee pain.  He is now contemplating knee replacement, he is otherwise asymptomatic.   Reviewed recent labs. Diabetes is well controlled. Cholesterol is also well controlled on Crestor and Fenofibrate. Blood pressure remains controlled as well with current medical therapy. There is no significant change in EKG compared to 09/01/2018 and 2021. Will continue guideline directed medical therapy for coronary artery disease.  He also has stable chronic stage IIIa kidney disease.  Although it has been several years ago that he has had CABG, he has never had any cardiac evaluation per se, unless he is going for surgery, will continue observation for now and consider stress test and echocardiogram preoperatively.  I have discussed with him regarding weight loss.   Jackson Prows, MD, [facility] 08/29/2020,  12:28 PM Office: [phone]

## 2020-09-08 ENCOUNTER — Other Ambulatory Visit: Payer: Self-pay | Admitting: Cardiology

## 2020-09-16 ENCOUNTER — Encounter: Payer: Self-pay | Admitting: Cardiology

## 2021-01-18 ENCOUNTER — Emergency Department (HOSPITAL_COMMUNITY)
Admission: EM | Admit: 2021-01-18 | Discharge: 2021-01-18 | Disposition: A | Discharge status: Home / Self Care | Payer: Medicare Other | Attending: Emergency Medicine | Admitting: Emergency Medicine

## 2021-01-18 ENCOUNTER — Emergency Department (HOSPITAL_COMMUNITY): Payer: Medicare Other

## 2021-01-18 ENCOUNTER — Other Ambulatory Visit: Payer: Self-pay

## 2021-01-18 DIAGNOSIS — M25561 Pain in right knee: Secondary | ICD-10-CM | POA: Insufficient documentation

## 2021-01-18 DIAGNOSIS — W01198A Fall on same level from slipping, tripping and stumbling with subsequent striking against other object, initial encounter: Secondary | ICD-10-CM | POA: Insufficient documentation

## 2021-01-18 DIAGNOSIS — Z7982 Long term (current) use of aspirin: Secondary | ICD-10-CM | POA: Diagnosis not present

## 2021-01-18 DIAGNOSIS — G8929 Other chronic pain: Secondary | ICD-10-CM | POA: Diagnosis not present

## 2021-01-18 DIAGNOSIS — Z79899 Other long term (current) drug therapy: Secondary | ICD-10-CM | POA: Insufficient documentation

## 2021-01-18 DIAGNOSIS — Z96651 Presence of right artificial knee joint: Secondary | ICD-10-CM | POA: Diagnosis not present

## 2021-01-18 DIAGNOSIS — W19XXXA Unspecified fall, initial encounter: Secondary | ICD-10-CM

## 2021-01-18 MED ORDER — LIDOCAINE 5 % EX PTCH: 1.0000 | MEDICATED_PATCH | CUTANEOUS | 0 refills | Status: DC

## 2021-01-18 MED ORDER — LIDOCAINE 5 % EX PTCH
1.0000 | MEDICATED_PATCH | CUTANEOUS | Status: DC
  Administered 2021-01-18: 1 via TRANSDERMAL
  Filled 2021-01-18: qty 1

## 2021-01-18 MED ORDER — HYDROCODONE-ACETAMINOPHEN 5-325 MG PO TABS
1.0000 | ORAL_TABLET | Freq: Once | ORAL | Status: AC
  Administered 2021-01-18: 1 via ORAL
  Filled 2021-01-18: qty 1

## 2021-01-18 MED ORDER — OXYCODONE HCL 5 MG PO TABS: 5.0000 mg | ORAL_TABLET | ORAL | 0 refills | Status: DC | PRN

## 2021-01-18 NOTE — ED Provider Notes (Signed)
[facility] EMERGENCY DEPARTMENT Provider Note   CSN: [id number] Arrival date & time: 01/18/21  1158     History  Chief Complaint  Patient presents with   Vertis Kelch is a 81 y.o. male recent right TKR with Dr. Ronnie Derby for evaluation after mechanical fall which occurred earlier today.  States he fell onto his right knee, hit right side of head.  Denies LOC, anticoagulation.  Unable to ambulate secondary to pain.  Uses walker.  Lives with wife.  Has had some difficulty with ambulation since surgery a month ago. Current in PT. No headache, lightheadedness, dizziness, chest pain, numbness or weakness prior to fall.  Has had some chronic swelling to his right knee since prior TKR. Rates his pain a 7/10.  No pain to bilateral pelvis, hips, femur, tib-fib or feet. Denies back pain, neck pain, HA  HPI     Home Medications Prior to Admission medications   Medication Sig Start Date End Date Taking? Authorizing Provider  lidocaine (LIDODERM) 5 % Place 1 patch onto the skin daily. Remove & Discard patch within 12 hours or as directed by MD 01/18/21  Yes Houda Brau A, PA-C  oxyCODONE (ROXICODONE) 5 MG immediate release tablet Take 1 tablet (5 mg total) by mouth every 4 (four) hours as needed for severe pain. 01/18/21  Yes Nanette Wirsing A, PA-C  acetaminophen (TYLENOL) 325 MG tablet Take 650 mg by mouth every 6 (six) hours as needed.    [provider]  amLODipine (NORVASC) 5 MG tablet TAKE 1 TABLET BY MOUTH  DAILY 09/08/20   Adrian Prows, MD  aspirin EC 81 MG tablet Take 81 mg by mouth daily.    [provider]  fenofibrate 160 MG tablet Take 160 mg by mouth daily.    [provider]  olmesartan-hydrochlorothiazide (BENICAR HCT) 40-12.5 MG tablet Take 1 tablet by mouth daily.    [provider]  oxybutynin (DITROPAN) 5 MG tablet Take 5 mg by mouth 2 (two) times a day.    [provider]  propranolol ER (INDERAL LA) 120  MG 24 hr capsule Take 120 mg by mouth daily.    [provider]  rosuvastatin (CRESTOR) 20 MG tablet TAKE 1 TABLET BY MOUTH  DAILY 04/29/18   Adrian Prows, MD  tamsulosin (FLOMAX) 0.4 MG CAPS capsule Take 0.4 mg by mouth daily.    [provider]      Allergies    Patient has no known allergies.    Review of Systems   Review of Systems  Constitutional: Negative.   HENT: Negative.    Respiratory: Negative.    Cardiovascular: Negative.   Gastrointestinal: Negative.   Genitourinary: Negative.   Musculoskeletal:        Right knee pain  Skin: Negative.   Neurological: Negative.   All other systems reviewed and are negative.  Physical Exam Updated Vital Signs BP (!) 147/72 (BP Location: Right Arm)    Pulse 70    Temp 98 F (36.7 C) (Oral)    Resp 15    SpO2 99%  Physical Exam Vitals and nursing note reviewed.  Constitutional:      General: He is not in acute distress.    Appearance: He is well-developed. He is not ill-appearing, toxic-appearing or diaphoretic.  HENT:     Head: Normocephalic and atraumatic.     Nose: Nose normal.     Mouth/Throat:     Mouth: Mucous membranes are  moist.  Eyes:     Pupils: Pupils are equal, round, and reactive to light.  Cardiovascular:     Rate and Rhythm: Normal rate and regular rhythm.     Pulses:          Radial pulses are 2+ on the right side and 2+ on the left side.       Dorsalis pedis pulses are 1+ on the right side and 1+ on the left side.       Posterior tibial pulses are 1+ on the right side and 1+ on the left side.     Heart sounds: Normal heart sounds.  Pulmonary:     Effort: Pulmonary effort is normal. No respiratory distress.     Breath sounds: Normal breath sounds.  Abdominal:     General: Bowel sounds are normal. There is no distension.     Palpations: Abdomen is soft.  Musculoskeletal:        General: Normal range of motion.     Cervical back: Normal range of motion and neck supple.     Comments: No  midline C/T/L tenderness. Pelvis stable, nontender palpation.  No shortening or rotation of legs.  Nontender bilateral femur, tib-fib, feet.  Diffuse tenderness right anterior knee.  Midline surgical incision, healing without wound dehiscence erythema, drainage ,warmth. Able to flex and extend at knee. Some mild overlying swelling.  Skin:    General: Skin is warm and dry.  Neurological:     General: No focal deficit present.     Mental Status: He is alert and oriented to person, place, and time.    ED Results / Procedures / Treatments   Labs (all labs ordered are listed, but only abnormal results are displayed) Labs Reviewed - No data to display  EKG None  Radiology CT Head Wo Contrast  Result Date: 01/18/2021 CLINICAL DATA:  Fall with head trauma EXAM: CT HEAD WITHOUT CONTRAST TECHNIQUE: Contiguous axial images were obtained from the base of the skull through the vertex without intravenous contrast. RADIATION DOSE REDUCTION: This exam was performed according to the departmental dose-optimization program which includes automated exposure control, adjustment of the mA and/or kV according to patient size and/or use of iterative reconstruction technique. COMPARISON:  None. FINDINGS: Brain: Generalized atrophy. Chronic small-vessel ischemic changes affect the cerebral hemispheric white matter. No sign of acute infarction, mass lesion, hemorrhage, hydrocephalus or extra-axial collection. Vascular: There is atherosclerotic calcification of the major vessels at the base of the brain. Skull: No skull fracture. Sinuses/Orbits: Clear except for an opacified posterior ethmoid air cell on the right. Orbits negative. Other: None IMPRESSION: No acute intracranial finding. Atrophy and chronic small-vessel ischemic change of the hemispheric white matter. No traumatic finding. Opacified and slightly expanded posterior ethmoid air cell on the right. Electronically Signed   By: Nelson Chimes M.D.   On: 01/18/2021  12:38   CT Cervical Spine Wo Contrast  Result Date: 01/18/2021 CLINICAL DATA:  [place] with trauma to the head and neck EXAM: CT CERVICAL SPINE WITHOUT CONTRAST TECHNIQUE: Multidetector CT imaging of the cervical spine was performed without intravenous contrast. Multiplanar CT image reconstructions were also generated. RADIATION DOSE REDUCTION: This exam was performed according to the departmental dose-optimization program which includes automated exposure control, adjustment of the mA and/or kV according to patient size and/or use of iterative reconstruction technique. COMPARISON:  None. FINDINGS: Alignment: No malalignment in the cervical region. Mild upper thoracic kyphotic curvature. Skull base and vertebrae: No regional fracture.  No focal  lesion. Soft tissues and spinal canal: No traumatic soft tissue finding. Disc levels: Foramen magnum is widely patent. Ordinary osteoarthritis at the C1-2 articulation. Mild cystic change within the dens. C2-3: Chronic fusion.  No stenosis. C3-4: Endplate osteophytes and mild bulging of the disc. No stenosis. C4-5: Endplate osteophytes and facet osteoarthritis. Mild bilateral foraminal narrowing. C5-6: Endplate osteophytes and mild facet degeneration. Mild foraminal stenosis. C6-7: Endplate osteophytes. Mild facet degeneration. Mild foraminal stenosis. C7-T1: Chronic fusion.  Wide patency. T1-2: Facet osteoarthritis.  No stenosis. T2 through T4: Solid bridging osteophytes. Upper chest: Negative Other: None IMPRESSION: No acute or traumatic finding. Chronic fusions as above. Chronic degenerative changes as above. Electronically Signed   By: Nelson Chimes M.D.   On: 01/18/2021 12:41   DG Knee Complete 4 Views Right  Result Date: 01/18/2021 CLINICAL DATA:  Trauma, fall EXAM: RIGHT KNEE - COMPLETE 4+ VIEW COMPARISON:  None. FINDINGS: There is previous right knee arthroplasty. No recent fracture or dislocation is seen. There is soft tissue fullness in the suprapatellar bursa.  There is subcutaneous stranding. In the lateral view, there is a 14 x 4 mm low-density in the anterior cortex of proximal shaft of tibia. There is no break in the cortical margins. Arterial calcifications are seen in the soft tissues. IMPRESSION: No recent fracture or dislocation is seen in the right knee. Previous right knee arthroplasty. Possible effusion in the suprapatellar bursa. There is 14 x 4 mm radiolucency in the anterior cortex of proximal shaft of right tibia. There is no break in the cortical margins. Significance of this finding is not clear. Comparison with previous studies or short-term follow-up radiographs along with MRI if warranted may be considered. Electronically Signed   By: Elmer Picker M.D.   On: 01/18/2021 12:59    Procedures Procedures    Medications Ordered in ED Medications  lidocaine (LIDODERM) 5 % 1 patch (1 patch Transdermal Patch Applied 01/18/21 1850)  HYDROcodone-acetaminophen (NORCO/VICODIN) 5-325 MG per tablet 1 tablet (1 tablet Oral Given 01/18/21 1850)    ED Course/ Medical Decision Making/ A&P    Pleasant 81 year old here for evaluation for mechanical fall which occurred PTA.  No preceding syncopal, syncopal symptoms.  Has not attempted to ambulate since fall due to right knee pain.  Diffuse tenderness about right knee, walks with a walker, currently still in PT for right TKR 1 month ago.  Has had some mild overlying swelling however no erythema, warmth.  Midline incision appears to be healing, no wound dehiscence.  No midline tenderness of bilateral tib-fib, femur, pelvis.  No shortening rotation of legs. No midline C/T/L tenderness  Imaging personally reviewed and interpreted:  Ct head without acute abnormality CT cervical without acute abnormality DG right knee without hardware movement, fracture, dislocation, Possible small overlying swelling.  Possible cortical lucency right midshaft tibia, unknown etiology.  Patient without any bony tenderness  to this region.  We will have him follow with orthopedics for this.  Will attempt pain management, ambulation and reassess.  Patient reassessed.  Pain improved.  He is ambulatory with walker which he states he uses intermittently at home.  Discussed close follow-up with orthopedics, return for new or worsening symptoms.  Patient agreeable.  The patient has been appropriately medically screened and/or stabilized in the ED. I have low suspicion for any other emergent medical condition which would require further screening, evaluation or treatment in the ED or require inpatient management.  Patient is hemodynamically stable and in no acute distress.  Patient able  to ambulate in department prior to ED.  Evaluation does not show acute pathology that would require ongoing or additional emergent interventions while in the emergency department or further inpatient treatment.  I have discussed the diagnosis with the patient and answered all questions.  Pain is been managed while in the emergency department and patient has no further complaints prior to discharge.  Patient is comfortable with plan discussed in room and is stable for discharge at this time.  I have discussed strict return precautions for returning to the emergency department.  Patient was encouraged to follow-up with PCP/specialist refer to at discharge.                           Medical Decision Making Amount and/or Complexity of Data Reviewed External Data Reviewed: radiology and notes.    Details: Ortho notes, outpatient PT, imaging studies Radiology: ordered and independent interpretation performed. Decision-making details documented in ED Course.  Risk OTC drugs. Prescription drug management. Diagnosis or treatment significantly limited by social determinants of health. Risk Details: Difficulty with mobility          Final Clinical Impression(s) / ED Diagnoses Final diagnoses:  Fall, initial encounter  Chronic pain of  right knee    Rx / DC Orders ED Discharge Orders          Ordered    oxyCODONE (ROXICODONE) 5 MG immediate release tablet  Every 4 hours PRN        01/18/21 2003    lidocaine (LIDODERM) 5 %  Every 24 hours        01/18/21 2003              Adrian Prince 01/18/21 2007    Pickering, Nathan, MD 01/18/21 2329

## 2021-01-18 NOTE — Discharge Instructions (Addendum)
I have written you for a few medications to help with your pain  One of them is an opiate medication, Take as needed. This medication is an opiate medication.  Does have the addictive potential.  May also make you sleepy, dizzy.  Follow-up with Dr. Valentina Gu within the next few days.  Return for new or worsening symptoms

## 2021-01-18 NOTE — ED Triage Notes (Signed)
Pt had mechanical fall d/t tripping on sidewalk this morning. Recent r knee replacement 4 weeks ago by Dr. Lorre Nick. Did hit head but didn't pass out. No obvious trauma to head. C/o R knee pain. No blood thinners, just ASA.

## 2021-08-28 ENCOUNTER — Encounter: Payer: Self-pay | Admitting: Cardiology

## 2021-08-28 ENCOUNTER — Inpatient Hospital Stay: Payer: Medicare Other

## 2021-08-28 ENCOUNTER — Ambulatory Visit: Payer: Medicare Other | Admitting: Cardiology

## 2021-08-28 VITALS — BP 124/60 | HR 74 | Temp 98.1°F | Resp 16 | Ht 65.0 in | Wt 227.0 lb

## 2021-08-28 DIAGNOSIS — I2510 Atherosclerotic heart disease of native coronary artery without angina pectoris: Secondary | ICD-10-CM

## 2021-08-28 DIAGNOSIS — I453 Trifascicular block: Secondary | ICD-10-CM

## 2021-08-28 DIAGNOSIS — E782 Mixed hyperlipidemia: Secondary | ICD-10-CM

## 2021-08-28 DIAGNOSIS — I10 Essential (primary) hypertension: Secondary | ICD-10-CM

## 2021-08-28 DIAGNOSIS — R55 Syncope and collapse: Secondary | ICD-10-CM

## 2021-08-28 NOTE — Progress Notes (Unsigned)
Primary Physician/Referring:  [name]  Patient ID: Jackson [name], male    DOB: January 26, 1940, 81 y.o.   MRN: [id number]  Chief Complaint  Patient presents with   Coronary Artery Disease   Hypertension   Follow-up    1 year   HPI: Jackson [name]  is a 81 y.o. male  male  with history of known coronary artery disease s/p CABG 2011, hypertension, hyperlipidemia, obesity, and diabetes mellitus.  Patient presents today for 1 year follow up. Patient has had right TKA in December 2022, had been doing well until he had a fall on 01/18/2021, states that while he was getting down from the sidewalk onto the pavement, his legs gave way.  She has had 1 more episode of fall a month ago, stating that he suddenly felt his legs give away with no strength.  On further questioning, he denies any obvious syncope or near syncope, no other associated symptoms.  Patient is since the episode of fall, he now has been having low threshold to use a walker.  He denies chest pain or dyspnea, does admit to marked decreased limited activity and 70 lifestyle.  Denies chest pain, worsening shortness of breath, PND, orthopnea, claudication, palpitations, syncope.    Past Medical History:  Diagnosis Date   CAD without angina: 02/11/09: 99% proximal , 70% mid lesion in LAD.  CABG  02/14/09: LIMA to LAD 02/21/2009   Controlled diabetes mellitus without complication, with long-term current use of insulin ([place]) 05/22/2018   Hypertension    Past Surgical History:  Procedure Laterality Date   CARDIAC CATHETERIZATION     02/11/09: 99% proximal , 70% mid lesion in LAD. EF 65%:   CORONARY ARTERY BYPASS GRAFT     CABG 02/14/09: LIMA to LAD, Dr. Cyndia Bent    Social History   Tobacco Use   Smoking status: Never   Smokeless tobacco: Never  Substance Use Topics   Alcohol use: Not Currently   Marital Status: Married   No Known Allergies    Current Outpatient Medications:    acetaminophen (TYLENOL) 325 MG tablet, Take  650 mg by mouth every 6 (six) hours as needed., Disp: , Rfl:    amLODipine (NORVASC) 5 MG tablet, TAKE 1 TABLET BY MOUTH  DAILY, Disp: 90 tablet, Rfl: 3   aspirin EC 81 MG tablet, Take 81 mg by mouth daily., Disp: , Rfl:    fenofibrate 160 MG tablet, Take 160 mg by mouth daily., Disp: , Rfl:    olmesartan-hydrochlorothiazide (BENICAR HCT) 40-12.5 MG tablet, Take 1 tablet by mouth daily., Disp: , Rfl:    oxybutynin (DITROPAN) 5 MG tablet, Take 5 mg by mouth 2 (two) times a day., Disp: , Rfl:    oxyCODONE (ROXICODONE) 5 MG immediate release tablet, Take 1 tablet (5 mg total) by mouth every 4 (four) hours as needed for severe pain., Disp: 8 tablet, Rfl: 0   propranolol ER (INDERAL LA) 120 MG 24 hr capsule, Take 120 mg by mouth daily., Disp: , Rfl:    rosuvastatin (CRESTOR) 20 MG tablet, TAKE 1 TABLET BY MOUTH  DAILY, Disp: 90 tablet, Rfl: 3   tamsulosin (FLOMAX) 0.4 MG CAPS capsule, Take 0.4 mg by mouth daily., Disp: , Rfl:    Review of Systems  Cardiovascular:  Negative for chest pain, dyspnea on exertion (chronic and stable) and leg swelling.  Musculoskeletal:  Positive for falls and joint pain.  Gastrointestinal:  Negative for melena.  Neurological:  Positive for loss  of balance.   Objective  Blood pressure 124/60, pulse 74, temperature 98.1 F (36.7 C), temperature source Temporal, resp. rate 16, height '5\' 5"'  (1.651 m), weight 227 lb (103 kg), SpO2 95 %. Body mass index is 37.77 kg/m.     08/28/2021    2:10 PM 01/18/2021    8:04 PM 01/18/2021    4:14 PM  Vitals with BMI  Height '5\' 5"'     Weight 227 lbs    BMI 47.82    Systolic 956 213 086  Diastolic 60 72 70  Pulse 74 70 71     Physical Exam Vitals reviewed.  Constitutional:      Appearance: He is well-developed. He is obese.  Neck:     Vascular: No carotid bruit or JVD.  Cardiovascular:     Rate and Rhythm: Normal rate and regular rhythm.     Pulses: Normal pulses and intact distal pulses.     Heart sounds: Heart sounds are  distant. No murmur heard. Pulmonary:     Effort: Pulmonary effort is normal. No accessory muscle usage or respiratory distress.     Breath sounds: Normal breath sounds.  Abdominal:     General: Bowel sounds are normal.     Palpations: Abdomen is soft.  Musculoskeletal:     Right lower leg: No edema.     Left lower leg: No edema.    Radiology: No results found.  Laboratory examination:   Labs 08/04/2020:  A1c 6.0%.  Serum glucose 78 mg, BUN 23, creatinine 1.38, EGFR 47 mL, potassium 4.0, CMP otherwise normal.  Total cholesterol 127, triglycerides 69, HDL 53, LDL 60.  Cardiac Studies:   Carotid artery duplex 06/15/2016: Minimal stenosis in the right internal carotid artery (minimal). Antegrade right vertebral artery flow. Antegrade left vertebral artery flow. No significant change from 09/04/2010.  Coronary Angiography  02/11/09: 99% proximal , 70% mid lesion in LAD. EF 65%: CABG 02/14/09: LIMA to LAD, Dr. Cyndia Bent.  EKG:  EKG 08/28/2021: Sinus rhythm with first-degree block at the rate of 70 bpm, left atrial enlargement, right axis deviation, left posterior fascicular block.  Right bundle branch block.  Trifascicular block.  Normal QT interval.  Compared to prior EKG 08/29/2020, previously left axis deviation and left anterior fascicular block.  There does not appear to be any misplacement of the leads.  Assessment     ICD-10-CM   1. Coronary artery disease involving native coronary artery of native heart without angina pectoris  I25.10 EKG 12-Lead    PCV MYOCARDIAL PERFUSION WITH LEXISCAN    PCV ECHOCARDIOGRAM COMPLETE    2. 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 Essential hypertension  I10     5. Mixed hyperlipidemia  E78.2      Orders Placed This Encounter  Procedures   PCV MYOCARDIAL PERFUSION WITH LEXISCAN    Standing Status:   Future    Standing Expiration Date:   10/28/2021    EKG 12-Lead   PCV ECHOCARDIOGRAM COMPLETE    Standing Status:   Future    Standing Expiration Date:   08/29/2022   No orders of the defined types were placed in this encounter.  Medications Discontinued During This Encounter  Medication Reason   lidocaine (LIDODERM) 5 %       Recommendations:   Jackson [name]  is a 81 y.o. male  with history of coronary  artery disease s/p CABG 2011, hypertension, hyperlipidemia, obesity, and diabetes mellitus.  Patient presents today for 1 year follow up.  Patient has had right TKA in December 2022, had been doing well until he had a fall on 01/18/2021, states that while he was getting down from the sidewalk onto the pavement, his legs gave way.  She has had 1 more episode of fall a month ago, stating that he suddenly felt his legs give away with no strength.  On further questioning, he denies any obvious syncope or near syncope, no other associated symptoms.  I reviewed his EKG, concerning thing is that unexplained sudden lack of strength in his legs may indicate heart block in view of underlying trifascicular block.  Reviewed likely set him up for extended EKG monitoring in the outpatient basis.  Patient has not had any cardiac work-up in many years, we will schedule him for Lexiscan nuclear stress test and an echocardiogram to reestablish his baseline.  Call to make out any symptoms of angina.  In view of markedly sedentary lifestyle.  I did not make any changes to his medications, I would like to see him back in 4 to 6 weeks for follow-up.     Adrian Prows, MD, [facility] 08/29/2021, 9:47 PM Office: [phone]

## 2021-08-29 ENCOUNTER — Other Ambulatory Visit: Payer: Self-pay

## 2021-08-29 DIAGNOSIS — R55 Syncope and collapse: Secondary | ICD-10-CM

## 2021-09-18 ENCOUNTER — Ambulatory Visit: Payer: Medicare Other

## 2021-09-18 DIAGNOSIS — R55 Syncope and collapse: Secondary | ICD-10-CM

## 2021-09-18 DIAGNOSIS — I2510 Atherosclerotic heart disease of native coronary artery without angina pectoris: Secondary | ICD-10-CM

## 2021-09-19 LAB — PCV MYOCARDIAL PERFUSION WITH LEXISCAN
SDS: 2
ST Depression (mm): 0 mm

## 2021-09-22 ENCOUNTER — Other Ambulatory Visit: Payer: Medicare Other

## 2021-09-28 ENCOUNTER — Ambulatory Visit: Payer: Medicare Other

## 2021-09-28 DIAGNOSIS — R55 Syncope and collapse: Secondary | ICD-10-CM

## 2021-09-28 DIAGNOSIS — I2510 Atherosclerotic heart disease of native coronary artery without angina pectoris: Secondary | ICD-10-CM

## 2021-10-04 ENCOUNTER — Other Ambulatory Visit: Payer: Self-pay | Admitting: Cardiology

## 2021-10-12 ENCOUNTER — Ambulatory Visit: Payer: Medicare Other | Admitting: Cardiology

## 2021-10-12 ENCOUNTER — Encounter: Payer: Self-pay | Admitting: Cardiology

## 2021-10-12 VITALS — BP 137/61 | HR 69 | Temp 98.1°F | Resp 16 | Ht 65.0 in | Wt 227.0 lb

## 2021-10-12 DIAGNOSIS — I453 Trifascicular block: Secondary | ICD-10-CM

## 2021-10-12 DIAGNOSIS — I2510 Atherosclerotic heart disease of native coronary artery without angina pectoris: Secondary | ICD-10-CM

## 2021-10-12 DIAGNOSIS — R55 Syncope and collapse: Secondary | ICD-10-CM

## 2021-10-12 NOTE — Progress Notes (Signed)
Primary Physician/Referring:  [name]  Patient ID: Jackson [name], male    DOB: 12/22/1940, 81 y.o.   MRN: [id number]  Chief Complaint  Patient presents with   Coronary Artery Disease   Abnormal ECG   Follow-up   HPI: Jackson [name]  is a 81 y.o. male  male  with history of coronary artery disease s/p CABG 2011, hypertension, hyperlipidemia, obesity, and diabetes mellitus.    He has undergone right knee replacement left 10/27/2020  and had done well until January 2023, had an episode of fall which was unexplained and prior to that, before had 1 more episode of syncope/near syncope, unexplained.    He denies chest pain or dyspnea, does admit to marked decreased limited activity and 70 lifestyle.  Denies chest pain, worsening shortness of breath, PND, orthopnea, claudication, palpitations, syncope.    Past Medical History:  Diagnosis Date   CAD without angina: 02/11/09: 99% proximal , 70% mid lesion in LAD.  CABG  02/14/09: LIMA to LAD 02/21/2009   Controlled diabetes mellitus without complication, with long-term current use of insulin ([place] 05/22/2018   Hypertension    Past Surgical History:  Procedure Laterality Date   CARDIAC CATHETERIZATION     02/11/09: 99% proximal , 70% mid lesion in LAD. EF 65%:   CORONARY ARTERY BYPASS GRAFT     CABG 02/14/09: LIMA to LAD, Dr. Cyndia Bent    Social History   Tobacco Use   Smoking status: Never   Smokeless tobacco: Never  Substance Use Topics   Alcohol use: Not Currently   Marital Status: Married   No Known Allergies    Current Outpatient Medications:    acetaminophen (TYLENOL) 325 MG tablet, Take 650 mg by mouth every 6 (six) hours as needed., Disp: , Rfl:    amLODipine (NORVASC) 5 MG tablet, TAKE 1 TABLET BY MOUTH  DAILY, Disp: 90 tablet, Rfl: 3   aspirin EC 81 MG tablet, Take 81 mg by mouth daily., Disp: , Rfl:    fenofibrate 160 MG tablet, Take 160 mg by mouth daily., Disp: , Rfl:    insulin glargine (LANTUS SOLOSTAR)  100 UNIT/ML Solostar Pen, Inject into the skin., Disp: , Rfl:    olmesartan-hydrochlorothiazide (BENICAR HCT) 40-12.5 MG tablet, Take 1 tablet by mouth daily., Disp: , Rfl:    oxybutynin (DITROPAN) 5 MG tablet, Take 5 mg by mouth 2 (two) times a day., Disp: , Rfl:    oxyCODONE (ROXICODONE) 5 MG immediate release tablet, Take 1 tablet (5 mg total) by mouth every 4 (four) hours as needed for severe pain., Disp: 8 tablet, Rfl: 0   propranolol ER (INDERAL LA) 120 MG 24 hr capsule, Take 120 mg by mouth daily., Disp: , Rfl:    rosuvastatin (CRESTOR) 20 MG tablet, TAKE 1 TABLET BY MOUTH  DAILY, Disp: 90 tablet, Rfl: 3   tamsulosin (FLOMAX) 0.4 MG CAPS capsule, Take 0.4 mg by mouth daily., Disp: , Rfl:    Review of Systems  Cardiovascular:  Negative for chest pain, dyspnea on exertion (chronic and stable) and leg swelling.  Musculoskeletal:  Positive for falls and joint pain.  Gastrointestinal:  Negative for melena.  Neurological:  Positive for loss of balance.   Objective  Blood pressure 137/61, pulse 69, temperature 98.1 F (36.7 C), temperature source Temporal, resp. rate 16, height _0  (1.651 m), weight 227 lb (103 kg), SpO2 96 %. Body mass index is 37.77 kg/m.     10/12/2021  1:46 PM 08/28/2021    2:10 PM 01/18/2021    8:04 PM  Vitals with BMI  Height $Remov'5\' 5"'IxhVYt$  $Remove'5\' 5"'DCWbSOw$    Weight 227 lbs 227 lbs   BMI 01.65 53.74   Systolic 827 078 675  Diastolic 61 60 72  Pulse 69 74 70     Physical Exam Vitals reviewed.  Constitutional:      Appearance: He is well-developed. He is obese.  Neck:     Vascular: No carotid bruit or JVD.  Cardiovascular:     Rate and Rhythm: Normal rate and regular rhythm.     Pulses: Normal pulses and intact distal pulses.     Heart sounds: Heart sounds are distant. No murmur heard. Pulmonary:     Effort: Pulmonary effort is normal. No accessory muscle usage or respiratory distress.     Breath sounds: Normal breath sounds.  Abdominal:     General: Bowel sounds are  normal.     Palpations: Abdomen is soft.  Musculoskeletal:     Right lower leg: No edema.     Left lower leg: No edema.    Radiology: No results found.  Laboratory examination:   Labs 08/04/2020:  A1c 6.0%.  Serum glucose 78 mg, BUN 23, creatinine 1.38, EGFR 47 mL, potassium 4.0, CMP otherwise normal.  Total cholesterol 127, triglycerides 69, HDL 53, LDL 60.  Cardiac Studies:   Coronary Angiography  02/20/2009: 99% proximal , 70% mid lesion in LAD. EF 65%: CABG 02/14/09: LIMA to LAD, Dr. Cyndia Bent.  PCV MYOCARDIAL PERFUSION WITH LEXISCAN 09/18/2021  Narrative Lexiscan  Nuclear stress test 09/18/2021: Myocardial perfusion is abnormal.  Prominent diaphragmatic attenuation noted. There is a large fixed severe defect in the inferior, lateral and apical regions. Minimal reversibility in the lateral region cannot be excluded. TID ratio 1.19, which is normal. Overall LV systolic function is normal without regional wall motion abnormalities. Stress LV EF: 75%. Nondiagnostic ECG stress. The heart rate response was consistent with Regadenoson. No previous exam available for comparison. Suspect defect to be soft tissue attenuation than a true scar in view of preserved LVEF and no obvious wall motion abnormality. Low risk. Clinical correlation recommended.   PCV ECHOCARDIOGRAM COMPLETE 09/28/2021  Narrative Echocardiogram 09/28/2021: Study Quality: Technically difficult. Normal LV systolic function with visual EF 50-55%. Left ventricle cavity is normal in size. Normal global wall motion. Abnormal septal wall motion due to post-operative septum. Mild left ventricular hypertrophy. Presence of a septal bulge. Doppler evidence of grade I (impaired) diastolic dysfunction, elevated LAP. Unable to evaluate regional wall motion due to poor visualization of endocardial border. Trileaflet aortic valve.  Mild (Grade I) aortic regurgitation. Aortic valve sclerosis without stenosis. No prior study for  comparison.  Zio Patch Extended out patient EKG monitoring 11 days starting 08/28/2021:  Predominant rhythm is normal sinus rhythm.  Minimum heart rate 40, maximum heart rate 117 bpm.  First-degree AV block was present.  Rare PVCs and PACs were noted.  There was 1 ventricular triplet.  No symptoms reported. No atrial fibrillation, no high degree AV block.  No other significant arrhythmias.     EKG:  EKG 10/12/2021: Sinus rhythm with first-degree block at rate of 63 bpm, left axis deviation, left anterior fascicular block.  Right bundle branch block.  Trifascicular block.  No significant change from 08/28/2021.  Compared to 08/29/2020, right bundle branch block is new.   Assessment     ICD-10-CM   1. Near syncope  R55     2. 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 Coronary artery disease involving native coronary artery of native heart without angina pectoris  I25.10 EKG 12-Lead     Orders Placed This Encounter  Procedures   EKG 12-Lead   No orders of the defined types were placed in this encounter.  There are no discontinued medications.     Recommendations:   Jackson [name]  is a 81 y.o. male  with history of coronary artery disease s/p CABG 2011, hypertension, hyperlipidemia, obesity, and diabetes mellitus.    He has undergone right knee replacement left 10/27/2020  and had done well until January 2023, had an episode of fall which was unexplained and prior to that, before had 1 more episode of syncope/near syncope, unexplained.   I reviewed the results of the extended EKG monitoring, no high degree AV block.  He has not had any further episodes of near syncope.  I discussed with him to contact us immediately if he were to ever have an episode where he feels like he is going to pass out.  I would have a very low threshold to place an event monitor again or even consider placing a loop recorder implantation.  Bundle branch block is new since last office evaluation 6 weeks  ago.  Fortunately his stress test and echocardiogram revealed no evidence of ischemia and he has normal and preserved LV systolic function.  Overall stable from cardiac standpoint, I will see him back in 6 months for follow-up of trifascicular block, underlying coronary artery disease and hypertension.      Adrian Prows, MD, [facility] 10/12/2021, 2:47 PM Office: [phone]

## 2021-10-20 ENCOUNTER — Other Ambulatory Visit: Payer: Self-pay | Admitting: Orthopedic Surgery

## 2021-10-20 DIAGNOSIS — Z96651 Presence of right artificial knee joint: Secondary | ICD-10-CM

## 2021-10-26 ENCOUNTER — Ambulatory Visit
Admission: RE | Admit: 2021-10-26 | Discharge: 2021-10-26 | Disposition: A | Discharge status: Home / Self Care | Payer: Medicare Other | Source: Ambulatory Visit | Attending: Orthopedic Surgery | Admitting: Orthopedic Surgery

## 2021-10-26 DIAGNOSIS — Z96651 Presence of right artificial knee joint: Secondary | ICD-10-CM

## 2022-04-13 ENCOUNTER — Ambulatory Visit: Payer: Medicare Other | Admitting: Cardiology

## 2022-04-13 ENCOUNTER — Encounter: Payer: Self-pay | Admitting: Cardiology

## 2022-04-13 VITALS — BP 147/74 | HR 80 | Ht 65.0 in | Wt 227.6 lb

## 2022-04-13 DIAGNOSIS — E119 Type 2 diabetes mellitus without complications: Secondary | ICD-10-CM

## 2022-04-13 DIAGNOSIS — I453 Trifascicular block: Secondary | ICD-10-CM

## 2022-04-13 DIAGNOSIS — I2510 Atherosclerotic heart disease of native coronary artery without angina pectoris: Secondary | ICD-10-CM

## 2022-04-13 DIAGNOSIS — E782 Mixed hyperlipidemia: Secondary | ICD-10-CM

## 2022-04-13 DIAGNOSIS — I10 Essential (primary) hypertension: Secondary | ICD-10-CM

## 2022-04-13 MED ORDER — SEMAGLUTIDE (1 MG/DOSE) 4 MG/3ML ~~LOC~~ SOPN: 1.0000 mg | PEN_INJECTOR | SUBCUTANEOUS | 1 refills | Status: DC

## 2022-04-13 MED ORDER — SEMAGLUTIDE(0.25 OR 0.5MG/DOS) 2 MG/1.5ML ~~LOC~~ SOPN
0.2500 mg | PEN_INJECTOR | Freq: Once | SUBCUTANEOUS | Status: AC
  Administered 2022-04-13: 0.25 mg via SUBCUTANEOUS

## 2022-04-13 NOTE — Progress Notes (Signed)
Primary Physician/Referring:  Lucila Maine  Patient ID: Jackson [name], male    DOB: 1940-11-30, 82 y.o.   MRN: [id number]  Chief Complaint  Patient presents with   Coronary artery disease involving native coronary artery of   Follow-up    6 month   HPI: Jackson [name]  is a 82 y.o. male  male  with history of coronary artery disease s/p CABG 2011, hypertension, hyperlipidemia, obesity, and diabetes mellitus.    He has undergone right knee replacement left 10/27/2020  and had done well until January 2023, had an episode of fall which was unexplained and prior to that, before had 1 more episode of syncope/near syncope, unexplained.  He has not had any further episodes of syncope.  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  He presents here for 80-month office visit.  Denies chest pain, worsening shortness of breath, PND, orthopnea, claudication, palpitations, syncope.    Past Medical History:  Diagnosis Date   CAD without angina: 02/11/09: 99% proximal , 70% mid lesion in LAD.  CABG  02/14/09: LIMA to LAD 02/21/2009   Controlled diabetes mellitus without complication, with long-term current use of insulin 05/22/2018   Hypertension    Past Surgical History:  Procedure Laterality Date   CARDIAC CATHETERIZATION     02/11/09: 99% proximal , 70% mid lesion in LAD. EF 65%:   CORONARY ARTERY BYPASS GRAFT     CABG 02/14/09: LIMA to LAD, Dr. Laneta Simmers   REPLACEMENT TOTAL KNEE Right    2023    Social History   Tobacco Use   Smoking status: Never   Smokeless tobacco: Never  Substance Use Topics   Alcohol use: Not Currently   Marital Status: Married  Review of Systems  Cardiovascular:  Negative for chest pain, dyspnea on exertion (chronic and stable) and leg swelling.  Musculoskeletal:  Positive for falls and joint pain.  Gastrointestinal:  Negative for melena.  Neurological:  Positive for loss of balance.   Objective  Blood pressure (!) 147/74, pulse 80, height 5\' 5"  (1.651  m), weight 227 lb 9.6 oz (103.2 kg), SpO2 96 %. Body mass index is 37.87 kg/m.     04/13/2022    8:45 AM 10/12/2021    1:46 PM 08/28/2021    2:10 PM  Vitals with BMI  Height 5\' 5"  5\' 5"  5\' 5"   Weight 227 lbs 10 oz 227 lbs 227 lbs  BMI 37.87 37.77 37.77  Systolic 147 137 817  Diastolic 74 61 60  Pulse 80 69 74     Physical Exam Vitals reviewed.  Constitutional:      Appearance: He is well-developed. He is obese.  Neck:     Vascular: No carotid bruit or JVD.  Cardiovascular:     Rate and Rhythm: Normal rate and regular rhythm.     Pulses: Normal pulses and intact distal pulses.     Heart sounds: Heart sounds are distant. No murmur heard. Pulmonary:     Effort: Pulmonary effort is normal. No accessory muscle usage or respiratory distress.     Breath sounds: Normal breath sounds.  Abdominal:     General: Bowel sounds are normal.     Palpations: Abdomen is soft.  Musculoskeletal:     Right lower leg: No edema.     Left lower leg: No edema.    Radiology: No results found.  Laboratory examination:   Labs 03/27/2022:  A1c 5.8%.  Labs 09/21/2021: Urine albumin/creatinine ratio 6, normal.  Total  cholesterol 131, triglycerides 115, HDL 49, LDL 63.  Serum glucose 96 mg, BUN 19, creatinine 1.20, EGFR 60 mill, potassium 3.6, LFTs normal.  Hb 13.0/HCT 39.5, platelets 256.  Labs 08/04/2020:  A1c 6.0%.  Serum glucose 78 mg, BUN 23, creatinine 1.38, EGFR 47 mL, potassium 4.0, CMP otherwise normal.  Total cholesterol 127, triglycerides 69, HDL 53, LDL 60.  Cardiac Studies:   Coronary Angiography  02/11/09: 99% proximal , 70% mid lesion in LAD. EF 65%: CABG 02/14/09: LIMA to LAD, Dr. Laneta SimmersBartle.  PCV MYOCARDIAL PERFUSION WITH LEXISCAN 09/18/2021  Narrative Lexiscan  Nuclear stress test 09/18/2021: Myocardial perfusion is abnormal.  Prominent diaphragmatic attenuation noted. There is a large fixed severe defect in the inferior, lateral and apical regions. Minimal reversibility in  the lateral region cannot be excluded. TID ratio 1.19, which is normal. Overall LV systolic function is normal without regional wall motion abnormalities. Stress LV EF: 75%. Nondiagnostic ECG stress. The heart rate response was consistent with Regadenoson. No previous exam available for comparison. Suspect defect to be soft tissue attenuation than a true scar in view of preserved LVEF and no obvious wall motion abnormality. Low risk. Clinical correlation recommended.   PCV ECHOCARDIOGRAM COMPLETE 09/28/2021  Narrative Echocardiogram 09/28/2021: Study Quality: Technically difficult. Normal LV systolic function with visual EF 50-55%. Left ventricle cavity is normal in size. Normal global wall motion. Abnormal septal wall motion due to post-operative septum. Mild left ventricular hypertrophy. Presence of a septal bulge. Doppler evidence of grade I (impaired) diastolic dysfunction, elevated LAP. Unable to evaluate regional wall motion due to poor visualization of endocardial border. Trileaflet aortic valve.  Mild (Grade I) aortic regurgitation. Aortic valve sclerosis without stenosis. No prior study for comparison.  Zio Patch Extended out patient EKG monitoring 11 days starting 08/28/2021:  Predominant rhythm is normal sinus rhythm.  Minimum heart rate 40, maximum heart rate 117 bpm.  First-degree AV block was present.  Rare PVCs and PACs were noted.  There was 1 ventricular triplet.  No symptoms reported. No atrial fibrillation, no high degree AV block.  No other significant arrhythmias.     EKG:  EKG 04/13/2022: Sinus rhythm with first-degree block at that of 66 bpm, left axis deviation, left anterior fascicular block.  Right bundle branch block.  Trifascicular block.  Compared to 10/12/2021, no significant change.    Allergies and Medications   Allergies  Allergen Reactions   Fosinopril     Other Reaction(s): Unknown     Current Outpatient Medications:    acetaminophen (TYLENOL) 325 MG  tablet, Take 650 mg by mouth every 6 (six) hours as needed., Disp: , Rfl:    amLODipine (NORVASC) 5 MG tablet, TAKE 1 TABLET BY MOUTH  DAILY, Disp: 90 tablet, Rfl: 3   aspirin EC 81 MG tablet, Take 81 mg by mouth daily., Disp: , Rfl:    fenofibrate 160 MG tablet, Take 160 mg by mouth daily., Disp: , Rfl:    insulin glargine (LANTUS SOLOSTAR) 100 UNIT/ML Solostar Pen, Inject into the skin., Disp: , Rfl:    olmesartan-hydrochlorothiazide (BENICAR HCT) 40-12.5 MG tablet, Take 1 tablet by mouth daily., Disp: , Rfl:    oxybutynin (DITROPAN) 5 MG tablet, Take 5 mg by mouth 2 (two) times a day., Disp: , Rfl:    propranolol ER (INDERAL LA) 120 MG 24 hr capsule, Take 120 mg by mouth daily., Disp: , Rfl:    rosuvastatin (CRESTOR) 20 MG tablet, TAKE 1 TABLET BY MOUTH  DAILY, Disp: 90 tablet, Rfl:  3   Semaglutide, 1 MG/DOSE, 4 MG/3ML SOPN, Inject 1 mg into the skin once a week., Disp: 9 mL, Rfl: 1   tamsulosin (FLOMAX) 0.4 MG CAPS capsule, Take 0.4 mg by mouth daily., Disp: , Rfl:    Assessment     ICD-10-CM   1. Coronary artery disease involving native coronary artery of native heart without angina pectoris  I25.10 EKG 12-Lead    Semaglutide, 1 MG/DOSE, 4 MG/3ML SOPN    2. Essential hypertension  I10     3. Mixed hyperlipidemia  E78.2     4. 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 Type 2 diabetes mellitus without complication, with long-term current use of insulin  E11.9 Semaglutide, 1 MG/DOSE, 4 MG/3ML SOPN   Z79.4      Orders Placed This Encounter  Procedures   EKG 12-Lead   Meds ordered this encounter  Medications   Semaglutide, 1 MG/DOSE, 4 MG/3ML SOPN    Sig: Inject 1 mg into the skin once a week.    Dispense:  9 mL    Refill:  1   Medications Discontinued During This Encounter  Medication Reason   oxyCODONE (ROXICODONE) 5 MG immediate release tablet       Administrations This Visit     Semaglutide(0.25 or 0.5MG /DOS) SOPN 0.25 mg     Admin Date 04/13/2022 Action Given  Dose 0.25 mg Route Subcutaneous Administered By Erby PianMilton, Cecilia, CMA             Recommendations:   Jackson [name]  is a 82 y.o. male  with history of coronary artery disease s/p CABG 2011, hypertension, hyperlipidemia, obesity, and diabetes mellitus without complications.    1. Coronary artery disease involving native coronary artery of native heart without angina pectoris Patient is presently doing well without recurrence of angina pectoris.  He has had a low risk stress test in September 2023.  He has normal LVEF.  Today there is no clinical evidence of heart failure.  I will [place] start him on Ozempic for cardiovascular protection, diabetes mellitus and also obesity.  - EKG 12-Lead - Semaglutide, 1 MG/DOSE, 4 MG/3ML SOPN; Inject 1 mg into the skin once a week.  Dispense: 9 mL; Refill: 1  2. Essential hypertension Blood pressure is slightly elevated today but I did not make any changes to his medications as I am starting him on Ozempic and this may lead to dehydration/weight loss/improvement in blood pressure.  I would like to monitor him closely.  3. Mixed hyperlipidemia Lipids under excellent control.  He is tolerating statins.  4. 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  Will continue to monitor this closely.  5. Controlled type 2 diabetes mellitus without complication, with long-term current use of insulin I have initiated Ozempic therapy today at 0.25 mg, he will be seen back in 4 weeks, will uptitrate to 0.5 mg at that time, long-term probably he may do well with 1 mg dose, Rx for 90 days has been sent to mail order pharmacy.  Patient is contemplating right knee surgery, I do believe that he still is fairly active for his age and that he would do well from surgery and have a better quality of life going forward.  - Semaglutide, 1 MG/DOSE, 4 MG/3ML SOPN; Inject 1 mg into the skin once a  week.  Dispense: 9 mL; Refill: 1     Yates DecampJay Doil Kamara, MD, [facility] 04/13/2022, 10:00 AM  Office: [phone]

## 2022-05-10 ENCOUNTER — Ambulatory Visit: Payer: Medicare Other | Admitting: Cardiology

## 2022-05-10 ENCOUNTER — Encounter: Payer: Self-pay | Admitting: Cardiology

## 2022-05-10 VITALS — BP 120/64 | HR 76 | Resp 16 | Ht 65.0 in | Wt 222.4 lb

## 2022-05-10 DIAGNOSIS — I2510 Atherosclerotic heart disease of native coronary artery without angina pectoris: Secondary | ICD-10-CM

## 2022-05-10 DIAGNOSIS — E119 Type 2 diabetes mellitus without complications: Secondary | ICD-10-CM

## 2022-05-10 NOTE — Progress Notes (Signed)
Primary Physician/Referring:  Lucila Maine  Patient ID: Jackson [name], male    DOB: March 30, 1940, 82 y.o.   MRN: [id number]  Chief Complaint  Patient presents with   Diabetes Mellitus   Hypertension   Obesity   HPI: Jackson [name]  is a 82 y.o. male  male  with history of coronary artery disease s/p CABG 2011, hypertension, hyperlipidemia, obesity, and diabetes mellitus.    He has undergone right knee replacement left 10/27/2020  and had done well until January 2023, had an episode of fall which was unexplained and prior to that, before had 1 more episode of syncope/near syncope, unexplained.  He has not had any further episodes of syncope.  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    On his last office visit on 04/13/2022, I started him on Ozempic which he is tolerating. Denies chest pain, worsening shortness of breath, PND, orthopnea, claudication, palpitations, syncope.    Past Medical History:  Diagnosis Date   CAD without angina: 02/11/09: 99% proximal , 70% mid lesion in LAD.  CABG  02/14/09: LIMA to LAD 02/21/2009   Controlled diabetes mellitus without complication, with long-term current use of insulin (HCC) 05/22/2018   Hypertension    Past Surgical History:  Procedure Laterality Date   CARDIAC CATHETERIZATION     02/11/09: 99% proximal , 70% mid lesion in LAD. EF 65%:   CORONARY ARTERY BYPASS GRAFT     CABG 02/14/09: LIMA to LAD, Dr. Laneta Simmers   REPLACEMENT TOTAL KNEE Right    2023    Social History   Tobacco Use   Smoking status: Never   Smokeless tobacco: Never  Substance Use Topics   Alcohol use: Not Currently   Marital Status: Married  Review of Systems  Cardiovascular:  Positive for dyspnea on exertion (chronic and stable). Negative for chest pain and leg swelling.  Gastrointestinal:  Negative for melena.  Neurological:  Positive for loss of balance.   Objective  Blood pressure 120/64, pulse 76, resp. rate 16, height 5\' 5"  (1.651 m), weight 222 lb 6.4  oz (100.9 kg), SpO2 97 %. Body mass index is 37.01 kg/m.     05/10/2022   10:15 AM 04/13/2022    8:45 AM 10/12/2021    1:46 PM  Vitals with BMI  Height 5\' 5"  5\' 5"  5\' 5"   Weight 222 lbs 6 oz 227 lbs 10 oz 227 lbs  BMI 37.01 37.87 37.77  Systolic 120 147 409  Diastolic 64 74 61  Pulse 76 80 69     Physical Exam Vitals reviewed.  Constitutional:      Appearance: He is well-developed. He is obese.  Neck:     Vascular: No carotid bruit or JVD.  Cardiovascular:     Rate and Rhythm: Normal rate and regular rhythm.     Pulses: Normal pulses and intact distal pulses.     Heart sounds: Heart sounds are distant. No murmur heard. Pulmonary:     Effort: Pulmonary effort is normal. No accessory muscle usage or respiratory distress.     Breath sounds: Normal breath sounds.  Abdominal:     General: Bowel sounds are normal.     Palpations: Abdomen is soft.  Musculoskeletal:     Right lower leg: No edema.     Left lower leg: No edema.    Radiology: No results found.  Laboratory examination:   Labs 03/27/2022:  A1c 5.8%.  Labs 09/21/2021: Urine albumin/creatinine ratio 6, normal.  Total cholesterol  131, triglycerides 115, HDL 49, LDL 63.  Serum glucose 96 mg, BUN 19, creatinine 1.20, EGFR 60 mL, potassium 3.6, LFTs normal.  Hb 13.0/HCT 39.5, platelets 256.  Labs 08/04/2020:  A1c 6.0%.  Serum glucose 78 mg, BUN 23, creatinine 1.38, EGFR 47 mL, potassium 4.0, CMP otherwise normal.  Total cholesterol 127, triglycerides 69, HDL 53, LDL 60.  Cardiac Studies:   Coronary Angiography  02-27-09: 99% proximal , 70% mid lesion in LAD. EF 65%: CABG 02/14/09: LIMA to LAD, Dr. Laneta Simmers.  PCV MYOCARDIAL PERFUSION WITH LEXISCAN 09/18/2021  Narrative Lexiscan  Nuclear stress test 09/18/2021: Myocardial perfusion is abnormal.  Prominent diaphragmatic attenuation noted. There is a large fixed severe defect in the inferior, lateral and apical regions. Minimal reversibility in the lateral region  cannot be excluded. TID ratio 1.19, which is normal. Overall LV systolic function is normal without regional wall motion abnormalities. Stress LV EF: 75%. Nondiagnostic ECG stress. The heart rate response was consistent with Regadenoson. No previous exam available for comparison. Suspect defect to be soft tissue attenuation than a true scar in view of preserved LVEF and no obvious wall motion abnormality. Low risk. Clinical correlation recommended.   PCV ECHOCARDIOGRAM COMPLETE 09/28/2021  Narrative Echocardiogram 09/28/2021: Study Quality: Technically difficult. Normal LV systolic function with visual EF 50-55%. Left ventricle cavity is normal in size. Normal global wall motion. Abnormal septal wall motion due to post-operative septum. Mild left ventricular hypertrophy. Presence of a septal bulge. Doppler evidence of grade I (impaired) diastolic dysfunction, elevated LAP. Unable to evaluate regional wall motion due to poor visualization of endocardial border. Trileaflet aortic valve.  Mild (Grade I) aortic regurgitation. Aortic valve sclerosis without stenosis. No prior study for comparison.  Zio Patch Extended out patient EKG monitoring 11 days starting 08/28/2021:  Predominant rhythm is normal sinus rhythm.  Minimum heart rate 40, maximum heart rate 117 bpm.  First-degree AV block was present.  Rare PVCs and PACs were noted.  There was 1 ventricular triplet.  No symptoms reported. No atrial fibrillation, no high degree AV block.  No other significant arrhythmias.     EKG:  EKG 04/13/2022: Sinus rhythm with first-degree block at that of 66 bpm, left axis deviation, left anterior fascicular block.  Right bundle branch block.  Trifascicular block.  Compared to 10/12/2021, no significant change.    Allergies and Medications   Allergies  Allergen Reactions   Fosinopril     Other Reaction(s): Unknown     Current Outpatient Medications:    acetaminophen (TYLENOL) 325 MG tablet, Take 650 mg  by mouth every 6 (six) hours as needed., Disp: , Rfl:    amLODipine (NORVASC) 5 MG tablet, TAKE 1 TABLET BY MOUTH  DAILY, Disp: 90 tablet, Rfl: 3   aspirin EC 81 MG tablet, Take 81 mg by mouth daily., Disp: , Rfl:    fenofibrate 160 MG tablet, Take 160 mg by mouth daily., Disp: , Rfl:    insulin glargine (LANTUS SOLOSTAR) 100 UNIT/ML Solostar Pen, Inject into the skin., Disp: , Rfl:    olmesartan-hydrochlorothiazide (BENICAR HCT) 40-12.5 MG tablet, Take 1 tablet by mouth daily., Disp: , Rfl:    oxybutynin (DITROPAN) 5 MG tablet, Take 5 mg by mouth 2 (two) times a day., Disp: , Rfl:    propranolol ER (INDERAL LA) 120 MG 24 hr capsule, Take 120 mg by mouth daily., Disp: , Rfl:    rosuvastatin (CRESTOR) 20 MG tablet, TAKE 1 TABLET BY MOUTH  DAILY, Disp: 90 tablet, Rfl: 3  Semaglutide, 1 MG/DOSE, 4 MG/3ML SOPN, Inject 1 mg into the skin once a week., Disp: 9 mL, Rfl: 1   tamsulosin (FLOMAX) 0.4 MG CAPS capsule, Take 0.4 mg by mouth daily., Disp: , Rfl:    Assessment     ICD-10-CM   1. Coronary artery disease involving native coronary artery of native heart without angina pectoris  I25.10     2. Type 2 diabetes mellitus without complication, with long-term current use of insulin (HCC)  E11.9 Basic metabolic panel   Z61.0     3. Class 2 severe obesity due to excess calories with serious comorbidity and body mass index (BMI) of 37.0 to 37.9 in adult [facility])  E66.01    Z68.37      Orders Placed This Encounter  Procedures   Basic metabolic panel   No orders of the defined types were placed in this encounter.  There are no discontinued medications.  Recommendations:   Jackson [name]  is a 82 y.o. male  with history of coronary artery disease s/p CABG 2011, hypertension, hyperlipidemia, obesity, and diabetes mellitus without complications.    1. Coronary artery disease involving native coronary artery of native heart without angina pectoris No symptoms of angina, no clinical evidence of  heart failure.  2. Type 2 diabetes mellitus without complication, with long-term current use of insulin (HCC) On his last office visit on 04/13/2022, I started him on Ozempic which he is tolerating.  States that his weight is down by 5 to 6 pounds.  He is eating less.  I have also advised him to reduce his insulin dose from 12 units to 8 units daily.  He will continue to monitor his blood sugar closely.  He will obtain BMP in 2 to 3 weeks.  Will continue to escalate his Ozempic to 1 mg subcu q. weekly once he is done with 0.5 mg subcu.  - Basic metabolic panel  3. Class 2 severe obesity due to excess calories with serious comorbidity and body mass index (BMI) of 37.0 to 37.9 in adult [facility]) Patient has started to lose weight, hopefully will be able to get his BMI reduced to around 30 that may help with both his arthritis and also overall wellbeing.  Again I discussed with him regarding calorie restriction, to eat slow and to eat less calorie food.  Office visit in 3 months.    Yates Decamp, MD, [facility] 05/10/2022, 11:08 AM Office: [phone]

## 2022-06-20 ENCOUNTER — Telehealth: Payer: Self-pay

## 2022-06-20 NOTE — Telephone Encounter (Signed)
Ozempic causing gas in chest and belly,causing constipation, blurred vision. He is taking laxatives and using enema but can barely have a bowel movement. He is unable to eat hardly anything. He is dizzy and having stomach pain.  He is also taking Tums and tylenol. Says the stomach pain is severe and he can not take the pain anymore.  He has quit taking the medication and does not ever want to take it again. He is still in pain what should he do?

## 2022-06-21 NOTE — Telephone Encounter (Signed)
It will go away after a few days (3-5). Most times these effects can be short lived and will settle down especially if he does not eat fried foods, greasy food and also he should eat small amount each time. He just has to wait to let side effects go or can go to the ED

## 2022-07-08 ENCOUNTER — Other Ambulatory Visit: Payer: Self-pay | Admitting: Cardiology

## 2022-07-27 ENCOUNTER — Other Ambulatory Visit: Payer: Self-pay | Admitting: Gastroenterology

## 2022-07-27 DIAGNOSIS — D649 Anemia, unspecified: Secondary | ICD-10-CM

## 2022-07-27 DIAGNOSIS — R634 Abnormal weight loss: Secondary | ICD-10-CM

## 2022-08-02 ENCOUNTER — Ambulatory Visit
Admission: RE | Admit: 2022-08-02 | Discharge: 2022-08-02 | Disposition: A | Discharge status: Home / Self Care | Payer: Medicare Other | Source: Ambulatory Visit | Attending: Gastroenterology | Admitting: Gastroenterology

## 2022-08-02 DIAGNOSIS — R634 Abnormal weight loss: Secondary | ICD-10-CM

## 2022-08-02 DIAGNOSIS — D649 Anemia, unspecified: Secondary | ICD-10-CM

## 2022-08-02 MED ORDER — IOPAMIDOL (ISOVUE-300) INJECTION 61%
100.0000 mL | Freq: Once | INTRAVENOUS | Status: AC | PRN
  Administered 2022-08-02: 100 mL via INTRAVENOUS

## 2022-08-09 ENCOUNTER — Ambulatory Visit: Payer: Medicare Other | Admitting: Cardiology

## 2022-08-10 ENCOUNTER — Ambulatory Visit: Payer: Medicare Other | Admitting: Cardiology

## 2022-08-10 ENCOUNTER — Encounter: Payer: Self-pay | Admitting: Cardiology

## 2022-08-10 VITALS — BP 124/61 | HR 76 | Resp 16 | Ht 65.0 in | Wt 216.8 lb

## 2022-08-10 DIAGNOSIS — I2510 Atherosclerotic heart disease of native coronary artery without angina pectoris: Secondary | ICD-10-CM

## 2022-08-10 DIAGNOSIS — I10 Essential (primary) hypertension: Secondary | ICD-10-CM

## 2022-08-10 DIAGNOSIS — E119 Type 2 diabetes mellitus without complications: Secondary | ICD-10-CM

## 2022-08-10 NOTE — Progress Notes (Signed)
Primary Physician/Referring:  Lucila Maine  Patient ID: Jackson [name], male    DOB: Apr 28, 1940, 82 y.o.   MRN: [id number]  Chief Complaint  Patient presents with   Coronary artery disease involving native coronary artery of   Hypertension   Obesity   Follow-up    3  months   HPI: Jackson [name]  is a 82 y.o. male  male  with history of coronary artery disease s/p CABG 2011, hypertension, hyperlipidemia, obesity, and diabetes mellitus.    He has undergone right knee replacement left 10/27/2020  and had done well until January 2023, had an episode of fall which was unexplained and prior to that, before had 1 more episode of syncope/near syncope, unexplained.  He has not had any further episodes of syncope.  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    On his last office visit on 04/13/2022, I started him on Ozempic which he is tolerating. Denies chest pain, worsening shortness of breath, PND, orthopnea, claudication, palpitations, syncope.    Past Medical History:  Diagnosis Date   CAD without angina: 02/11/09: 99% proximal , 70% mid lesion in LAD.  CABG  02/14/09: LIMA to LAD 02/21/2009   Controlled diabetes mellitus without complication, with long-term current use of insulin (HCC) 05/22/2018   Hypertension    Past Surgical History:  Procedure Laterality Date   CARDIAC CATHETERIZATION     02/11/09: 99% proximal , 70% mid lesion in LAD. EF 65%:   CORONARY ARTERY BYPASS GRAFT     CABG 02/14/09: LIMA to LAD, Dr. Laneta Simmers   REPLACEMENT TOTAL KNEE Right    2023    Social History   Tobacco Use   Smoking status: Never   Smokeless tobacco: Never  Substance Use Topics   Alcohol use: Not Currently   Marital Status: Married  Review of Systems  Cardiovascular:  Positive for dyspnea on exertion (chronic and stable). Negative for chest pain and leg swelling.  Gastrointestinal:  Negative for melena.  Neurological:  Positive for loss of balance.   Objective  Blood pressure  124/61, pulse 76, resp. rate 16, height 5\' 5"  (1.651 m), weight 216 lb 12.8 oz (98.3 kg), SpO2 96%. Body mass index is 36.08 kg/m.     08/10/2022    1:09 PM 05/10/2022   10:15 AM 04/13/2022    8:45 AM  Vitals with BMI  Height 5\' 5"  5\' 5"  5\' 5"   Weight 216 lbs 13 oz 222 lbs 6 oz 227 lbs 10 oz  BMI 36.08 37.01 37.87  Systolic 124 120 272  Diastolic 61 64 74  Pulse 76 76 80     Physical Exam Vitals reviewed.  Constitutional:      Appearance: He is well-developed. He is obese.  Neck:     Vascular: No carotid bruit or JVD.  Cardiovascular:     Rate and Rhythm: Normal rate and regular rhythm.     Pulses: Normal pulses and intact distal pulses.     Heart sounds: Heart sounds are distant. No murmur heard. Pulmonary:     Effort: Pulmonary effort is normal. No accessory muscle usage or respiratory distress.     Breath sounds: Normal breath sounds.  Abdominal:     General: Bowel sounds are normal.     Palpations: Abdomen is soft.  Musculoskeletal:     Right lower leg: No edema.     Left lower leg: No edema.    Radiology: No results found.  Laboratory examination:  Labs 03/27/2022:  A1c 5.8%.  Labs 09/21/2021: Urine albumin/creatinine ratio 6, normal.  Total cholesterol 131, triglycerides 115, HDL 49, LDL 63.  Serum glucose 96 mg, BUN 19, creatinine 1.20, EGFR 60 mL, potassium 3.6, LFTs normal.  Hb 13.0/HCT 39.5, platelets 256.  Labs 08/04/2020:  A1c 6.0%.  Serum glucose 78 mg, BUN 23, creatinine 1.38, EGFR 47 mL, potassium 4.0, CMP otherwise normal.  Total cholesterol 127, triglycerides 69, HDL 53, LDL 60.  Cardiac Studies:   Coronary Angiography  03-04-2009: 99% proximal , 70% mid lesion in LAD. EF 65%: CABG 02/14/09: LIMA to LAD, Dr. Laneta Simmers.  PCV MYOCARDIAL PERFUSION WITH LEXISCAN 09/18/2021  Narrative Lexiscan  Nuclear stress test 09/18/2021: Myocardial perfusion is abnormal.  Prominent diaphragmatic attenuation noted. There is a large fixed severe defect in the  inferior, lateral and apical regions. Minimal reversibility in the lateral region cannot be excluded. TID ratio 1.19, which is normal. Overall LV systolic function is normal without regional wall motion abnormalities. Stress LV EF: 75%. Nondiagnostic ECG stress. The heart rate response was consistent with Regadenoson. No previous exam available for comparison. Suspect defect to be soft tissue attenuation than a true scar in view of preserved LVEF and no obvious wall motion abnormality. Low risk. Clinical correlation recommended.   PCV ECHOCARDIOGRAM COMPLETE 09/28/2021  Narrative Echocardiogram 09/28/2021: Study Quality: Technically difficult. Normal LV systolic function with visual EF 50-55%. Left ventricle cavity is normal in size. Normal global wall motion. Abnormal septal wall motion due to post-operative septum. Mild left ventricular hypertrophy. Presence of a septal bulge. Doppler evidence of grade I (impaired) diastolic dysfunction, elevated LAP. Unable to evaluate regional wall motion due to poor visualization of endocardial border. Trileaflet aortic valve.  Mild (Grade I) aortic regurgitation. Aortic valve sclerosis without stenosis. No prior study for comparison.  Zio Patch Extended out patient EKG monitoring 11 days starting 08/28/2021:  Predominant rhythm is normal sinus rhythm.  Minimum heart rate 40, maximum heart rate 117 bpm.  First-degree AV block was present.  Rare PVCs and PACs were noted.  There was 1 ventricular triplet.  No symptoms reported. No atrial fibrillation, no high degree AV block.  No other significant arrhythmias.     EKG:  EKG 04/13/2022: Sinus rhythm with first-degree block at that of 66 bpm, left axis deviation, left anterior fascicular block.  Right bundle branch block.  Trifascicular block.  Compared to 10/12/2021, no significant change.    Allergies and Medications   Allergies  Allergen Reactions   Semaglutide Other (See Comments)    Other Reaction(s):  GI Intolerance  Acid reflux, constipation   Fosinopril     Other Reaction(s): Unknown     Current Outpatient Medications:    acetaminophen (TYLENOL) 325 MG tablet, Take 650 mg by mouth every 6 (six) hours as needed., Disp: , Rfl:    amLODipine (NORVASC) 5 MG tablet, TAKE 1 TABLET BY MOUTH DAILY, Disp: 100 tablet, Rfl: 2   aspirin EC 81 MG tablet, Take 81 mg by mouth daily., Disp: , Rfl:    fenofibrate 160 MG tablet, Take 160 mg by mouth daily., Disp: , Rfl:    ferrous sulfate 325 (65 FE) MG tablet, Take 325 mg by mouth daily with breakfast., Disp: , Rfl:    insulin glargine (LANTUS SOLOSTAR) 100 UNIT/ML Solostar Pen, Inject into the skin., Disp: , Rfl:    olmesartan-hydrochlorothiazide (BENICAR HCT) 40-12.5 MG tablet, Take 1 tablet by mouth daily., Disp: , Rfl:    oxybutynin (DITROPAN) 5 MG tablet, Take  5 mg by mouth 2 (two) times a day., Disp: , Rfl:    propranolol ER (INDERAL LA) 120 MG 24 hr capsule, Take 120 mg by mouth daily., Disp: , Rfl:    rosuvastatin (CRESTOR) 20 MG tablet, TAKE 1 TABLET BY MOUTH  DAILY, Disp: 90 tablet, Rfl: 3   tamsulosin (FLOMAX) 0.4 MG CAPS capsule, Take 0.4 mg by mouth daily., Disp: , Rfl:    Vitamin D, Ergocalciferol, (DRISDOL) 1.25 MG (50000 UNIT) CAPS capsule, Take 50,000 Units by mouth every 7 (seven) days., Disp: , Rfl:    Assessment     ICD-10-CM   1. Coronary artery disease involving native coronary artery of native heart without angina pectoris  I25.10     2. Type 2 diabetes mellitus without complication, with long-term current use of insulin (HCC)  E11.9    Z79.4     3. Essential hypertension  I10      No orders of the defined types were placed in this encounter.  No orders of the defined types were placed in this encounter.  Medications Discontinued During This Encounter  Medication Reason   Semaglutide, 1 MG/DOSE, 4 MG/3ML SOPN Side effect (s)    Recommendations:   CARNEL STEGMAN  is a 82 y.o. male  with history of coronary  artery disease s/p CABG 2011, hypertension, hyperlipidemia, obesity, and diabetes mellitus without complications.    1. Coronary artery disease involving native coronary artery of native heart without angina pectoris No symptoms of angina, no clinical evidence of heart failure.  2. Type 2 diabetes mellitus without complication, with long-term current use of insulin (HCC) On his last office visit on 04/13/2022, I started him on Ozempic which he is tolerating.  States that his weight is down by 5 to 6 pounds.  He is eating less.  I have also advised him to reduce his insulin dose from 12 units to 8 units daily.  He will continue to monitor his blood sugar closely.  He will obtain BMP in 2 to 3 weeks.  Will continue to escalate his Ozempic to 1 mg subcu q. weekly once he is done with 0.5 mg subcu.  - Basic metabolic panel  3. Class 2 severe obesity due to excess calories with serious comorbidity and body mass index (BMI) of 37.0 to 37.9 in adult [facility]) Patient has started to lose weight, hopefully will be able to get his BMI reduced to around 30 that may help with both his arthritis and also overall wellbeing.  Again I discussed with him regarding calorie restriction, to eat slow and to eat less calorie food.  Office visit in 3 months.    Yates Decamp, MD, [facility] 08/10/2022, 2:15 PM Office: [phone]

## 2023-05-07 ENCOUNTER — Encounter: Payer: Self-pay | Admitting: Podiatry

## 2023-05-07 ENCOUNTER — Ambulatory Visit: Admitting: Podiatry

## 2023-05-07 DIAGNOSIS — B351 Tinea unguium: Secondary | ICD-10-CM | POA: Diagnosis not present

## 2023-05-07 DIAGNOSIS — M79676 Pain in unspecified toe(s): Secondary | ICD-10-CM

## 2023-05-07 DIAGNOSIS — D239 Other benign neoplasm of skin, unspecified: Secondary | ICD-10-CM | POA: Diagnosis not present

## 2023-05-07 NOTE — Progress Notes (Signed)
 Subjective:  Patient ID: Jackson [name], male    DOB: 06-Aug-1940,  MRN: [id number] HPI Chief Complaint  Patient presents with   Foot Pain    Left - patient states foot is getting longer than the right, PCP thinks his arch has collapsed, cut out the front part of his shoe for comfort, 2nd toe left has been rubbing-developed think nail and callus, diabetic - last A1c was 10.2   New Patient (Initial Visit)    83 y.o. male presents with the above complaint.   ROS: Denies fever chills nausea right muscle aches pains calf pain back pain chest pain shortness of breath.  Past Medical History:  Diagnosis Date   CAD without angina: 02/11/09: 99% proximal , 70% mid lesion in LAD.  CABG  02/14/09: LIMA to LAD 02/21/2009   Controlled diabetes mellitus without complication, with long-term current use of insulin (HCC) 05/22/2018   Hypertension    Past Surgical History:  Procedure Laterality Date   CARDIAC CATHETERIZATION     02/11/09: 99% proximal , 70% mid lesion in LAD. EF 65%:   CORONARY ARTERY BYPASS GRAFT     CABG 02/14/09: LIMA to LAD, Dr. Sherene Dilling   REPLACEMENT TOTAL KNEE Right    2023    Current Outpatient Medications:    Vitamin D, Ergocalciferol, (DRISDOL) 1.25 MG (50000 UNIT) CAPS capsule, Take 50,000 Units by mouth., Disp: , Rfl:    ACCU-CHEK GUIDE TEST test strip, SMARTSIG:Strip(s), Disp: , Rfl:    acetaminophen  (TYLENOL ) 325 MG tablet, Take 650 mg by mouth every 6 (six) hours as needed., Disp: , Rfl:    amLODipine (NORVASC) 5 MG tablet, TAKE 1 TABLET BY MOUTH DAILY, Disp: 100 tablet, Rfl: 2   aspirin EC 81 MG tablet, Take 81 mg by mouth daily., Disp: , Rfl:    fenofibrate 160 MG tablet, Take 160 mg by mouth daily., Disp: , Rfl:    ferrous sulfate 325 (65 FE) MG tablet, Take 325 mg by mouth daily with breakfast., Disp: , Rfl:    insulin glargine (LANTUS SOLOSTAR) 100 UNIT/ML Solostar Pen, Inject into the skin., Disp: , Rfl:    olmesartan-hydrochlorothiazide (BENICAR HCT) 40-12.5 MG  tablet, Take 1 tablet by mouth daily., Disp: , Rfl:    oxybutynin (DITROPAN) 5 MG tablet, Take 5 mg by mouth 2 (two) times a day., Disp: , Rfl:    propranolol ER (INDERAL LA) 120 MG 24 hr capsule, Take 120 mg by mouth daily., Disp: , Rfl:    rosuvastatin (CRESTOR) 20 MG tablet, TAKE 1 TABLET BY MOUTH  DAILY, Disp: 90 tablet, Rfl: 3   tamsulosin (FLOMAX) 0.4 MG CAPS capsule, Take 0.4 mg by mouth daily., Disp: , Rfl:   Allergies  Allergen Reactions   Semaglutide  Other (See Comments)    Other Reaction(s): GI Intolerance  Acid reflux, constipation   Fosinopril     Other Reaction(s): Unknown   Review of Systems Objective:  There were no vitals filed for this visit.  General: Well developed, nourished, in no acute distress, alert and oriented x3   Dermatological: Skin is warm, dry and supple bilateral. Nails x 10 are long thick yellow dystrophic clinically mycotic.  Callus to the distal aspect of the second digit left foot exquisitely painful.  Remaining integument appears unremarkable at this time. There are no open sores, no preulcerative lesions, no rash or signs of infection present.  Vascular: Dorsalis Pedis artery and Posterior Tibial artery pedal pulses are 2/4 bilateral with immedate capillary fill time. Pedal  hair growth present. No varicosities and no lower extremity edema present bilateral.   Neruologic: Grossly intact via light touch bilateral. Vibratory intact via tuning fork bilateral. Protective threshold with Semmes Wienstein monofilament intact to all pedal sites bilateral. Patellar and Achilles deep tendon reflexes 2+ bilateral. No Babinski or clonus noted bilateral.   Musculoskeletal: No gross boney pedal deformities bilateral. No pain, crepitus, or limitation noted with foot and ankle range of motion bilateral. Muscular strength 5/5 in all groups tested bilateral.  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     Radiographs:  None taken  Assessment & Plan:   Assessment: Pain limb secondary to long thick onychomycotic nails.  Also hammertoe deformity with distal clavus second digit left foot.  Plan: Debrided nails and calluses bilateral.     Safire Gordin T. [place], [place]

## 2023-05-27 ENCOUNTER — Other Ambulatory Visit: Payer: Self-pay | Admitting: Cardiology

## 2023-08-06 ENCOUNTER — Ambulatory Visit: Admitting: Podiatry

## 2023-08-06 ENCOUNTER — Encounter: Payer: Self-pay | Admitting: Podiatry

## 2023-08-06 DIAGNOSIS — B351 Tinea unguium: Secondary | ICD-10-CM | POA: Diagnosis not present

## 2023-08-06 DIAGNOSIS — Z794 Long term (current) use of insulin: Secondary | ICD-10-CM

## 2023-08-06 DIAGNOSIS — N183 Chronic kidney disease, stage 3 unspecified: Secondary | ICD-10-CM

## 2023-08-06 DIAGNOSIS — M79676 Pain in unspecified toe(s): Secondary | ICD-10-CM

## 2023-08-06 DIAGNOSIS — E119 Type 2 diabetes mellitus without complications: Secondary | ICD-10-CM | POA: Diagnosis not present

## 2023-08-06 NOTE — Progress Notes (Addendum)
 This patient returns to my office for at risk foot care.  This patient requires this care by a professional since this patient will be at risk due to having diabetes and CKD. This patient is unable to cut nails himself since the patient cannot reach his nails.These nails are painful walking and wearing shoes.  This patient presents for at risk foot care today.  General Appearance  Alert, conversant and in no acute stress.  Vascular  Dorsalis pedis and posterior tibial  pulses are palpable  bilaterally.  Capillary return is within normal limits  bilaterally. Temperature is within normal limits  bilaterally.  Neurologic  Senn-Weinstein monofilament wire test within normal limits  bilaterally. Muscle power within normal limits bilaterally.  Nails Thick disfigured discolored nails with subungual debris  from hallux to fifth toes bilaterally. No evidence of bacterial infection or drainage bilaterally.  Orthopedic  No limitations of motion  feet .  No crepitus or effusions noted.  No bony pathology or digital deformities noted.  Skin  normotropic skin with no porokeratosis noted bilaterally.  No signs of infections or ulcers noted.   Distal clavi second toe left foot.  Onychomycosis  Pain in right toes  Pain in left toes  Consent was obtained for treatment procedures.   Mechanical debridement of nails 1-5  bilaterally performed with a nail nipper.  Filed with dremel without incident.    Return office visit   3 months                   Told patient to return for periodic foot care and evaluation due to potential at risk complications.   Cordella Bold DPM

## 2023-08-12 ENCOUNTER — Ambulatory Visit: Payer: Medicare Other | Admitting: Cardiology

## 2023-08-18 NOTE — Progress Notes (Signed)
 Cardiology Office Note:  .   Date:  08/19/2023  ID:  Jackson [name], DOB 1940-04-17, MRN [id number] PCP: Jacques Camie Pepper, PA-C  [facility] Providers Cardiologist:  Gordy Bergamo, MD   History of Present Illness: .   Jackson [name] is a 83 y.o. male with history of coronary artery disease s/p CABG 2011, abnormal EKG with trifascicular block, hypertension, hyperlipidemia, obesity, and diabetes mellitus.  He is accompanied by his wife Inocente, remains asymptomatic.  Cardiac Studies relevent.    CAD without angina: 02/11/09: 99% proximal , 70% mid lesion in LAD.  CABG  02/14/09: LIMA to LAD  Lexiscan  nuclear stress test 09/18/2021: Mostly diaphragmatic attenuation in the inferior wall with normal LVEF at 75%, low risk.  Echocardiogram 09/28/2021: Normal LV systolic function, EF 50 to 55% grade 1 diastolic dysfunction.  Mild aortic regurgitation.  Zio patch monitoring 2 weeks 08/28/2021: No high degree AV block, no atrial fibrillation.  Discussed the use of AI scribe software for clinical note transcription with the patient, who gave verbal consent to proceed.  History of Present Illness Jackson [name] is an 83 year old male with coronary artery disease and hypertension who presents for a cardiovascular follow-up. He is accompanied by his wife, Inocente.  He underwent bypass surgery in 2011 and currently experiences no chest pain or shortness of breath. A stress test in September 2023 was normal.  Hypertension is well controlled with amlodipine 5 mg once daily and propranolol ER 120 mg once daily, with stable blood pressure readings.  High cholesterol is managed with rosuvastatin 20 mg once daily and fenofibrate 160 mg once daily. Liver function tests and cholesterol levels have been monitored.  Labs   External Labs:  Care Everywhere labs 07/29/2023:  Urinary albumin to creatinine ratio 7,Vitamin D18.8  Labs 04/29/2023:  Total cholesterol 140, triglycerides 117, HDL 55,  LDL 64.  BUN 19, creatinine 1.06, EGFR 69 mL, potassium 4.1, LFTs normal.  A1c 5.7%.  Hb 12.3/HCT 37.1, platelets 214.  ROS  Review of Systems  Cardiovascular:  Negative for chest pain, dyspnea on exertion and leg swelling.   Physical Exam:   VS:  BP (!) 120/58 (BP Location: Left Arm, Patient Position: Sitting, Cuff Size: Large)   Pulse 76   Resp 16   Ht 5' 5 (1.651 m)   Wt 220 lb 11.2 oz (100.1 kg)   SpO2 98%   BMI 36.73 kg/m    Wt Readings from Last 3 Encounters:  08/19/23 220 lb 11.2 oz (100.1 kg)  08/10/22 216 lb 12.8 oz (98.3 kg)  05/10/22 222 lb 6.4 oz (100.9 kg)    Physical Exam Neck:     Vascular: No carotid bruit or JVD.  Cardiovascular:     Rate and Rhythm: Normal rate and regular rhythm.     Heart sounds: Heart sounds are distant. No murmur heard.    No gallop.  Pulmonary:     Effort: Pulmonary effort is normal.     Breath sounds: Normal breath sounds.  Abdominal:     General: Bowel sounds are normal.     Palpations: Abdomen is soft.  Musculoskeletal:     Right lower leg: No edema.     Left lower leg: No edema.    Studies Reviewed: SABRA     EKG:    EKG Interpretation Date/Time:  Monday August 19 2023 09:42:14 EDT Ventricular Rate:  71 PR Interval:  194 QRS Duration:  150 QT Interval:  428 QTC Calculation: 465  R Axis:   -50  Text Interpretation: EKG 08/19/2023: Normal sinus rhythm with rate of 71 bpm, left axis deviation, left anterior fascicular block.  Right bundle branch block.  Bifascicular block.  LVH.  Compared to 04/13/2022, first-degree AV block not present but otherwise no change. Confirmed by Talik Casique, Jagadeesh [phone]) on 08/19/2023 9:49:36 AM  EKG 04/13/2022: Sinus rhythm with first-degree block at that of 66 bpm, left axis deviation, left anterior fascicular block. Right bundle branch block. Trifascicular block.   Medications ordered    No orders of the defined types were placed in this encounter.    ASSESSMENT AND PLAN: .      ICD-10-CM    1. Coronary artery disease involving native coronary artery of native heart without angina pectoris  I25.10 EKG 12-Lead    2. Essential hypertension  I10     3. Mixed hyperlipidemia  E78.2     4. 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 Status post CABG with LIMA to LAD performed in 2011. No chest pain, shortness of breath, or heart failure symptoms. Recent stress test in September 2023 was normal. - Continue current management - Follow up in one year  Hypertension Hypertension is well-controlled. Currently on amlodipine 5 mg once daily and propranolol ER 120 mg once daily. Blood pressure is normal today. - Continue current antihypertensive regimen  Hyperlipidemia Hyperlipidemia is well-controlled. Currently on rosuvastatin 20 mg once daily and fenofibrate 160 mg once daily. Recent lipid panel shows LDL 64 mg/dL, triglycerides 882 mg/dL, total cholesterol 859 mg/dL, and HDL 55 mg/dL. Liver function tests are normal. - Continue current lipid-lowering therapy - Monitor lipid levels and liver function tests  Bifascicular block Bifascicular block noted on EKG. Previously had trifascicular block, but current EKG shows only bifascicular block. No episodes of syncope or atrial fibrillation. Event monitor in August 2023 showed no AV block or atrial fibrillation. - Follow up in one year   Signed,  Gordy Bergamo, MD, [facility] 08/19/2023, 10:09 AM [facility] [address] [place], [place] [address] Phone: [phone]. Fax:  [phone]

## 2023-08-19 ENCOUNTER — Encounter: Payer: Self-pay | Admitting: Cardiology

## 2023-08-19 ENCOUNTER — Ambulatory Visit: Attending: Cardiology | Admitting: Cardiology

## 2023-08-19 VITALS — BP 120/58 | HR 76 | Resp 16 | Ht 65.0 in | Wt 220.7 lb

## 2023-08-19 DIAGNOSIS — I10 Essential (primary) hypertension: Secondary | ICD-10-CM

## 2023-08-19 DIAGNOSIS — E782 Mixed hyperlipidemia: Secondary | ICD-10-CM | POA: Diagnosis not present

## 2023-08-19 DIAGNOSIS — I2510 Atherosclerotic heart disease of native coronary artery without angina pectoris: Secondary | ICD-10-CM | POA: Diagnosis not present

## 2023-08-19 DIAGNOSIS — I453 Trifascicular block: Secondary | ICD-10-CM | POA: Diagnosis not present

## 2023-08-19 NOTE — Patient Instructions (Signed)
 Medication Instructions:  Your physician recommends that you continue on your current medications as directed. Please refer to the Current Medication list given to you today.  *If you need a refill on your cardiac medications before your next appointment, please call your pharmacy*  Lab Work: none If you have labs (blood work) drawn today and your tests are completely normal, you will receive your results only by: MyChart Message (if you have MyChart) OR A paper copy in the mail If you have any lab test that is abnormal or we need to change your treatment, we will call you to review the results.  Testing/Procedures: none  Follow-Up: At [facility], you and your health needs are our priority.  As part of our continuing mission to provide you with exceptional heart care, our providers are all part of one team.  This team includes your primary Cardiologist (physician) and Advanced Practice Providers or APPs (Physician Assistants and Nurse Practitioners) who all work together to provide you with the care you need, when you need it.  Your next appointment:   12 month(s)  Provider:   Gordy Bergamo, MD    We recommend signing up for the patient portal called MyChart.  Sign up information is provided on this After Visit Summary.  MyChart is used to connect with patients for Virtual Visits (Telemedicine).  Patients are able to view lab/test results, encounter notes, upcoming appointments, etc.  Non-urgent messages can be sent to your provider as well.   To learn more about what you can do with MyChart, go to [email or web address].   Other Instructions

## 2023-10-28 ENCOUNTER — Other Ambulatory Visit: Payer: Self-pay | Admitting: Cardiology
